# Patient Record
Sex: Female | Born: 1955
Health system: Southern US, Community
[De-identification: ages and names within clinical notes are randomized; demographics above are authoritative.]

## PROBLEM LIST (undated history)

## (undated) HISTORY — PX: FOOT SURGERY: SHX648

---

## 2000-11-05 ENCOUNTER — Emergency Department (HOSPITAL_COMMUNITY): Admission: EM | Admit: 2000-11-05 | Discharge: 2000-11-05 | Payer: Self-pay | Admitting: Emergency Medicine

## 2000-11-07 ENCOUNTER — Emergency Department (HOSPITAL_COMMUNITY): Admission: EM | Admit: 2000-11-07 | Discharge: 2000-11-07 | Payer: Self-pay | Admitting: Emergency Medicine

## 2001-03-19 ENCOUNTER — Encounter: Payer: Self-pay | Admitting: Internal Medicine

## 2001-03-19 ENCOUNTER — Ambulatory Visit (HOSPITAL_COMMUNITY): Admission: RE | Admit: 2001-03-19 | Discharge: 2001-03-19 | Payer: Self-pay | Admitting: Internal Medicine

## 2003-01-29 ENCOUNTER — Emergency Department (HOSPITAL_COMMUNITY): Admission: EM | Admit: 2003-01-29 | Discharge: 2003-01-29 | Payer: Self-pay | Admitting: *Deleted

## 2005-06-08 ENCOUNTER — Other Ambulatory Visit: Admission: RE | Admit: 2005-06-08 | Discharge: 2005-06-08 | Payer: Self-pay | Admitting: Obstetrics & Gynecology

## 2008-11-06 ENCOUNTER — Ambulatory Visit (HOSPITAL_BASED_OUTPATIENT_CLINIC_OR_DEPARTMENT_OTHER): Admission: RE | Admit: 2008-11-06 | Discharge: 2008-11-06 | Payer: Self-pay | Admitting: Orthopedic Surgery

## 2010-11-26 LAB — POCT HEMOGLOBIN-HEMACUE: Hemoglobin: 13.3 g/dL (ref 12.0–15.0)

## 2010-12-29 NOTE — Op Note (Signed)
NAMEANJANA, CHEEK NO.:  [id number]   MEDICAL RECORD NO.:  [id number]          PATIENT TYPE:  AMB   LOCATION:  [facility]                         FACILITY:  [facility]   PHYSICIAN:  Ollen Gross, M.D.    DATE OF BIRTH:  Jun 13, 1956   DATE OF PROCEDURE:  11/06/2008  DATE OF DISCHARGE:                               OPERATIVE REPORT   PREOPERATIVE DIAGNOSIS:  Right knee meniscal tear and lateral patellar  tilt.   POSTOPERATIVE DIAGNOSIS:  Right knee meniscal tear and lateral patellar  tilt.   OPERATION/PROCEDURE:  Right knee arthroscopy with chondroplasty,  meniscal debridement and lateral retinacular release.   SURGEON:  Ollen Gross, M.D.   ANESTHESIA:  General.   ESTIMATED BLOOD LOSS:  Minimal.   DRAINS:  Hemovac times one.   COMPLICATIONS:  None.   CONDITION:  Stable to recovery room.   CLINICAL NOTE:  Gavin Pound is a 55 year old female who has had a long  history of problems related to her right knee.  She has had a lot of  pain and mechanical-type symptoms.  Exam and history suggested meniscal  tear.  She also had significant patellofemoral symptoms and has  significant lateral tilt of the patella.  She presents now for the above-  mentioned procedure.   PROCEDURE IN DETAIL:  After successful administration of general  anesthetic, a tourniquet is placed on the right thigh and right lower  extremity is prepped and draped in the usual sterile fashion.  Standard  superomedial and lateral incisions were made.  Inflow cannula passed  superomedial, camera passed inferolateral.  Arthroscopic visualization  proceeds.  Undersurface of patella shows some mild chondromalacia and a  little unstable cartilage on the lateral aspect.  The trochlea also has  some chondromalacia laterally.  Centrally the trochlea looks normal as  does the medial aspect.  The medial and lateral gutters are visualized.  There are no loose bodies.  Flexion valgus force applied and to the  knee  and the medial compartment entered.  The medial compartment shows no  evidence of any tears or chondral defects.  A spinal needle is used to  localize the inferomedial portal.  Small incision made, dilator placed.  Intercondylar notch is then visualized.  The ACL appears normal.  Lateral compartment entered and there is no evidence of any articular  cartilage damage, but there is a tear in the posterior horn lateral  meniscus.  This is debrided back to stable base with baskets and a 4.2  mm shaver.  It is probed and found to be stable.  I then addressed the  patellofemoral compartment and with the shaver debrided any unstable  cartilage from the trochlea and patella.  We did not have to debride  down to bone as there was good stable cartilaginous base.  I then marked  the junction of the superior and lateral borders of patella with the  spinal needle.  We switched the cannula to do the camera inferomedial  and working portal inferolateral.  I began a lateral release starting at  the spot marked with a spinal needle and  took that down to the  inferolateral portal.  We use arthroscopic cautery for this.  Once the  release was completed, then I decreased the inflow pressure to identify  any bleeding points.  All bleeding is coagulated with the cautery.  The  joints again inspected.  There are no other tears or loose bodies noted.  The arthroscope is then removed from the inferior portals which are  closed with interrupted 4-0 nylon and 20 mL of 0.25% Marcaine with epi  injected through the inflow cannula.  Then a Hemovac drain was threaded  through the cannula and the cannula is removed.  The incisions closed,  but the drain is not sewn in.  Drains hooked to suction.  The incisions  cleaned and dried and bulky sterile dressing applied.  She is then  awakened and transferred to recovery in stable condition.      Ollen Gross, M.D.  Electronically Signed     FA/MEDQ  D:   11/06/2008  T:  11/07/2008  Job:  [id number]

## 2011-03-11 ENCOUNTER — Emergency Department (HOSPITAL_COMMUNITY): Payer: BC Managed Care – PPO

## 2011-03-11 ENCOUNTER — Emergency Department (HOSPITAL_COMMUNITY)
Admission: EM | Admit: 2011-03-11 | Discharge: 2011-03-11 | Disposition: A | Discharge status: Home / Self Care | Payer: BC Managed Care – PPO | Attending: Emergency Medicine | Admitting: Emergency Medicine

## 2011-03-11 DIAGNOSIS — IMO0002 Reserved for concepts with insufficient information to code with codable children: Secondary | ICD-10-CM | POA: Insufficient documentation

## 2011-03-11 DIAGNOSIS — W010XXA Fall on same level from slipping, tripping and stumbling without subsequent striking against object, initial encounter: Secondary | ICD-10-CM | POA: Insufficient documentation

## 2011-03-11 DIAGNOSIS — M25569 Pain in unspecified knee: Secondary | ICD-10-CM | POA: Insufficient documentation

## 2011-03-11 DIAGNOSIS — Y9289 Other specified places as the place of occurrence of the external cause: Secondary | ICD-10-CM | POA: Insufficient documentation

## 2011-03-11 DIAGNOSIS — Y9301 Activity, walking, marching and hiking: Secondary | ICD-10-CM | POA: Insufficient documentation

## 2011-03-11 DIAGNOSIS — M79609 Pain in unspecified limb: Secondary | ICD-10-CM | POA: Insufficient documentation

## 2012-02-01 ENCOUNTER — Telehealth: Payer: Self-pay

## 2012-02-01 ENCOUNTER — Ambulatory Visit (INDEPENDENT_AMBULATORY_CARE_PROVIDER_SITE_OTHER): Payer: BC Managed Care – PPO | Admitting: Physician Assistant

## 2012-02-01 VITALS — BP 139/75 | HR 86 | Temp 98.6°F | Resp 18 | Ht 66.0 in | Wt 181.0 lb

## 2012-02-01 DIAGNOSIS — R05 Cough: Secondary | ICD-10-CM

## 2012-02-01 DIAGNOSIS — R059 Cough, unspecified: Secondary | ICD-10-CM

## 2012-02-01 DIAGNOSIS — J40 Bronchitis, not specified as acute or chronic: Secondary | ICD-10-CM

## 2012-02-01 MED ORDER — AZITHROMYCIN 250 MG PO TABS: ORAL_TABLET | ORAL | Status: AC

## 2012-02-01 MED ORDER — IPRATROPIUM BROMIDE 0.06 % NA SOLN: 2.0000 | Freq: Three times a day (TID) | NASAL | Status: AC

## 2012-02-01 MED ORDER — BENZONATATE 100 MG PO CAPS: 100.0000 mg | ORAL_CAPSULE | Freq: Three times a day (TID) | ORAL | Status: AC | PRN

## 2012-02-01 NOTE — Telephone Encounter (Signed)
PT WAS JUST SEEN BY RYAN AND WANTED TO KNOW IF THEY COULD CONTINUE TAKING THE TYLENOL. PLEASE CALL [phone]

## 2012-02-01 NOTE — Progress Notes (Signed)
Patient ID: JAHIRA SWISS MRN: [id number], DOB: July 17, 1956, 56 y.o. Date of Encounter: 02/01/2012, 9:27 AM  Primary Physician: Ginette Otto, MD  Chief Complaint:  Chief Complaint  Patient presents with  . Nasal Congestion    all sx's over 1 week- has tried many OTC meds  . Cough  . Sore Throat  . Fever    low-grade at night    HPI: 56 y.o. year old female presents with a seven day history of nasal congestion, post nasal drip, sore throat, and cough. Mild sinus pressure. Afebrile. No chills. Nasal congestion thick and yellow. Cough is mildly productive of green/yellow sputum and not associated with time of day. Ears feel full, leading to sensation of muffled hearing. Has tried OTC cold preps without success. No GI complaints. Appetite normal.  No sick contacts, recent antibiotics, or recent travels.   No leg trauma, sedentary periods, h/o cancer, or tobacco use.  No past medical history on file.   Home Meds: Prior to Admission medications   Medication Sig Start Date End Date Taking? Authorizing Provider  calcium carbonate (OS-CAL) 600 MG TABS Take 600 mg by mouth 2 (two) times daily with a meal.   Yes Historical Provider, MD  Multiple Vitamin (MULTIVITAMIN) tablet Take 1 tablet by mouth daily.   Yes Historical Provider, MD                         Allergies:  Allergies  Allergen Reactions  . Codeine Hives and Nausea Only  . Darvocet (Propoxyphene-Acetaminophen) Hives and Nausea Only  . Penicillins Hives and Nausea Only  . Percocet (Oxycodone-Acetaminophen) Hives and Nausea Only  . Sulfa Antibiotics Hives and Nausea Only    History   Social History  . Marital Status: Single    Spouse Name: N/A    Number of Children: N/A  . Years of Education: N/A   Occupational History  . Not on file.   Social History Main Topics  . Smoking status: Former Games developer  . Smokeless tobacco: Not on file  . Alcohol Use: Not on file  . Drug Use: Not on file  . Sexually  Active: Not on file   Other Topics Concern  . Not on file   Social History Narrative  . No narrative on file     Review of Systems: Constitutional: negative for chills, fever, night sweats or weight changes Cardiovascular: negative for chest pain or palpitations Respiratory: negative for hemoptysis, wheezing, or shortness of breath Abdominal: negative for abdominal pain, nausea, vomiting or diarrhea Dermatological: negative for rash Neurologic: negative for headache   Physical Exam: Blood pressure 139/75, pulse 86, temperature 98.6 F (37 C), temperature source Oral, resp. rate 18, height 5\' 6"  (1.676 m), weight 181 lb (82.101 kg)., Body mass index is 29.21 kg/(m^2). General: Well developed, well nourished, in no acute distress. Head: Normocephalic, atraumatic, eyes without discharge, sclera non-icteric, nares are congested. Bilateral auditory canals clear, TM's are without perforation, pearly grey with reflective cone of light bilaterally. No sinus TTP. Oral cavity moist, dentition normal. Posterior pharynx with post nasal drip and mild erythema. No peritonsillar abscess or tonsillar exudate. Neck: Supple. No thyromegaly. Full ROM. No lymphadenopathy. Lungs: Coarse breath sounds bilaterally without wheezes, rales, or rhonchi. Breathing is unlabored.  Heart: RRR with S1 S2. No murmurs, rubs, or gallops appreciated. Msk:  Strength and tone normal for age. Extremities: No clubbing or cyanosis. No edema. Neuro: Alert and oriented X 3. Moves all  extremities spontaneously. CNII-XII grossly in tact. Psych:  Responds to questions appropriately with a normal affect.     ASSESSMENT AND PLAN:  56 y.o. year old female with bronchitis. -Azithromycin 250 MG #6 2 po first day then 1 po next 4 days no RF -Tessalon Perles 100 mg 1 po tid prn cough #30 no RF  -Atrovent NS 0.06% 2 sprays each nare bid prn #1 no RF -Mucinex -Tylenol/Motrin prn -Rest/fluids -RTC precautions -RTC 3-5 days if no  improvement  Signed, Eula Listen, PA-C 02/01/2012 9:27 AM

## 2012-02-01 NOTE — Telephone Encounter (Signed)
ADVISED PT PER RYAN'S NOTE, IT IS OK TO TAKE TYLENOL/MOTRIN PRN.

## 2012-10-23 ENCOUNTER — Other Ambulatory Visit: Payer: Self-pay | Admitting: Podiatry

## 2012-10-23 DIAGNOSIS — M79671 Pain in right foot: Secondary | ICD-10-CM

## 2012-10-23 DIAGNOSIS — M79672 Pain in left foot: Secondary | ICD-10-CM

## 2012-10-27 ENCOUNTER — Ambulatory Visit
Admission: RE | Admit: 2012-10-27 | Discharge: 2012-10-27 | Disposition: A | Discharge status: Home / Self Care | Payer: BC Managed Care – PPO | Source: Ambulatory Visit | Attending: Podiatry | Admitting: Podiatry

## 2012-10-27 DIAGNOSIS — M79672 Pain in left foot: Secondary | ICD-10-CM

## 2013-02-14 ENCOUNTER — Other Ambulatory Visit: Payer: Self-pay | Admitting: Orthopedic Surgery

## 2013-02-14 DIAGNOSIS — M8430XD Stress fracture, unspecified site, subsequent encounter for fracture with routine healing: Secondary | ICD-10-CM

## 2013-02-26 ENCOUNTER — Ambulatory Visit
Admission: RE | Admit: 2013-02-26 | Discharge: 2013-02-26 | Disposition: A | Discharge status: Home / Self Care | Payer: BC Managed Care – PPO | Source: Ambulatory Visit | Attending: Orthopedic Surgery | Admitting: Orthopedic Surgery

## 2013-02-26 DIAGNOSIS — M8430XD Stress fracture, unspecified site, subsequent encounter for fracture with routine healing: Secondary | ICD-10-CM

## 2014-02-22 ENCOUNTER — Ambulatory Visit (INDEPENDENT_AMBULATORY_CARE_PROVIDER_SITE_OTHER): Payer: BC Managed Care – PPO

## 2014-02-22 VITALS — BP 132/73 | HR 73 | Resp 18

## 2014-02-22 DIAGNOSIS — Q828 Other specified congenital malformations of skin: Secondary | ICD-10-CM

## 2014-02-22 DIAGNOSIS — B070 Plantar wart: Secondary | ICD-10-CM

## 2014-02-22 NOTE — Progress Notes (Signed)
   Subjective:    Patient ID: Paige Diaz, female    DOB: 05-12-1956, 58 y.o.   MRN: [id number]  HPI I HAVE SOME PLACES ON THE SIDES OF BOTH FEET AND I HAD SURGERY ON MY LEFT FOOT LAST YEAR AND SORE AND TENDER AND HAVE TRIED TO TRIM OM THEM AND TRIED OVER THE COUNTER     Review of Systems  All other systems reviewed and are negative.      Objective:   Physical Exam 58 year old white female well-developed well-nourished oriented x3 with vital signs stable. Patient presents with a history of lesions sub-fifth MTP area and base of the fifth metatarsal bilateral. These diffuse callused areas also have central nucleated lesions within the central portion of the keratoses possibly consistent with porokeratosis versus verruca plantaris  Lower 30 objective findings reveal intact neurovascular status with pedal pulses palpable DP and PT +2/4 capillary refill time 3 seconds epicritic sensations intact and symmetric bilateral normal plantar response DTRs not elicited dermatologically skin color pigment normal hair growth diminished to absent there is keratoses sub fifth metatarsal head bilateral and along the styloid process or fifth metatarsal base bilateral. Within the keratoses are nucleated porokeratotic lesions or what appeared be verruca 2-3 small island lesions are noted within each callus these are painful on direct lateral compression. Patient's try treating in the past myself debridement also utilizing a 20% urea cream which provided temporary relief orthopedic biomechanical exam is remarkable for history of old Jones fracture left foot treated with screw fixation more than a year ago otherwise rectus foot type mild adductovarus rotation lesser digits 34 and 5 with some I. flexible contractures noted no open wounds ulcerations no secondary infection is noted       Assessment & Plan:  Assessment this time as verruca plantaris for support keratoses plantar foot sub-lateral column fifth met and  fifth met base areas. These lesions are debrided and dressed this time utilizing topical salicylic acid instructions for topical Occlusal or salicylic acid applications given to patient this time recommended applying daily for anywhere from 5-7 days or even up to 2 weeks if needed apply the salicylic acid under occlusion utilizing duct tape as instructed.   Contact the office if changes or exacerbations in the interim or within a month or 2 if it fails to resolve may consider more invasive or aggressive options.  Lynsay Fesperman DPM Harriet Masson DPM

## 2014-02-22 NOTE — Patient Instructions (Signed)

## 2015-03-24 ENCOUNTER — Ambulatory Visit: Payer: BC Managed Care – PPO | Admitting: Podiatry

## 2015-03-28 ENCOUNTER — Ambulatory Visit (INDEPENDENT_AMBULATORY_CARE_PROVIDER_SITE_OTHER): Payer: BC Managed Care – PPO | Admitting: Podiatry

## 2015-03-28 ENCOUNTER — Encounter: Payer: Self-pay | Admitting: Podiatry

## 2015-03-28 VITALS — BP 126/77 | HR 68 | Resp 12

## 2015-03-28 DIAGNOSIS — Q828 Other specified congenital malformations of skin: Secondary | ICD-10-CM

## 2015-04-03 NOTE — Progress Notes (Signed)
Paige Diaz, female   DOB: [birth year], 59 y.o.   MRN: [id number]  Subjective: 59 year old female presents the office today with complaints of painful lesions to the bottoms of both of her feet. She states that she tries to trim the areas herself between appointments and she has tried some over-the-counter wart treatment as she was told her last appointment she did have warts as well. She said that she has not been consistent with the treatment. She states that the lesions of foot ingrown which cause pain with ambulation and pressure. Denies any redness or drainage. No other complaints at this time.  Objective: AAO 3, NAD DP/PT pulses palpable, CRT less than 3 seconds Protective sensation intact with Simms Weinstein monofilament. Bilateral submetatarsal 5 head and fifth metatarsal base hyperkeratotic lesions. Upon debridement there is no underlying ulceration, drainage. There is no pinpoint bleeding or evidence of verruca. There is a small nucleated hyperkeratotic lesion to the right submetatarsal 3. A smaller lesion also noted on the left. Upon debridement of the right second metatarsal 3 lesion no underlying ulceration, drainage or other signs of infection. Most of the tenderness appears to be right submetatarsal 3. There is no pinpoint area bony tenderness or pain the vibratory sensation. No open lesions or pre-ulcer lesions identified elsewhere. No pain with calf compression, swelling, warmth, erythema.  Assessment: 59 year old female bilateral porokeratosis  Plan: -Treatment options discussed including all alternatives, risks, and complications -Lesion sharply debrided 6 without complication/bleeding. The areas were cleaned. A pad was placed around the right submetatarsal 3 lesion and salinocaine was applied followed by dry sterile dressing. Post procedure instructions were discussed the patient. Monitor for any clinical signs or symptoms of infection and directed to call the  office immediately should any occur or go to the ER. Follow-up as needed. In the meantime I encouraged her to call the office with any questions, concerns, change in symptoms.  Ovid Curd, DPM

## 2015-12-10 ENCOUNTER — Other Ambulatory Visit: Payer: Self-pay | Admitting: Obstetrics & Gynecology

## 2015-12-10 DIAGNOSIS — E2839 Other primary ovarian failure: Secondary | ICD-10-CM

## 2015-12-26 ENCOUNTER — Ambulatory Visit
Admission: RE | Admit: 2015-12-26 | Discharge: 2015-12-26 | Disposition: A | Discharge status: Home / Self Care | Payer: BC Managed Care – PPO | Source: Ambulatory Visit | Attending: Obstetrics & Gynecology | Admitting: Obstetrics & Gynecology

## 2015-12-26 DIAGNOSIS — E2839 Other primary ovarian failure: Secondary | ICD-10-CM

## 2018-02-07 ENCOUNTER — Other Ambulatory Visit: Payer: Self-pay | Admitting: Obstetrics & Gynecology

## 2018-02-07 DIAGNOSIS — E2839 Other primary ovarian failure: Secondary | ICD-10-CM

## 2018-03-06 ENCOUNTER — Ambulatory Visit (INDEPENDENT_AMBULATORY_CARE_PROVIDER_SITE_OTHER): Payer: Self-pay | Admitting: Nurse Practitioner

## 2018-03-06 VITALS — BP 120/84 | HR 74 | Wt 171.8 lb

## 2018-03-06 DIAGNOSIS — L209 Atopic dermatitis, unspecified: Secondary | ICD-10-CM

## 2018-03-06 MED ORDER — TRIAMCINOLONE ACETONIDE 0.1 % EX OINT: 1.0000 "application " | TOPICAL_OINTMENT | Freq: Two times a day (BID) | CUTANEOUS | 0 refills | Status: AC

## 2018-03-06 NOTE — Patient Instructions (Signed)

## 2018-03-06 NOTE — Progress Notes (Signed)
Subjective:     Paige Diaz is a 62 y.o. female who presents for evaluation of a rash involving the right antecubital. Rash started 4 days ago. Lesions are erythematous, and flat in texture. Rash has not changed over time. Rash is pruritic. Associated symptoms: none. Patient denies: fever, headache, irritability, nausea, sore throat and vomiting. Patient has not had contacts with similar rash. Patient has not had new exposures (soaps, lotions, laundry detergents, foods, medications, plants, insects or animals).  States she has tried the use of over-the-counter hydrocortisone without relief.  The following portions of the patient's history were reviewed and updated as appropriate: allergies, current medications and past medical history.  Review of Systems Constitutional: negative Eyes: negative Ears, nose, mouth, throat, and face: negative Respiratory: negative Cardiovascular: negative Integument/breast: positive for pruritus, rash and skin color change, negative for dryness and drainage    Objective:    There were no vitals taken for this visit. General:  alert, cooperative and no distress  Skin:  plaque noted on right antecubital measuring approximately 4cm x 4cm in diameter; +scaling, erythematous, flat, + skin thickening, no drainage or discharge,      Assessment:    atopic dermatitis    Plan:  Exam findings, diagnosis etiology and medication use and indications reviewed with patient. Follow- Up and discharge instructions provided. No emergent/urgent issues found on exam.  Patient verbalized understanding of information provided and agrees with plan of care (POC), all questions answered.  1. Atopic dermatitis, unspecified type  - triamcinolone ointment (KENALOG) 0.1 %; Apply 1 application topically 2 (two) times daily for 14 days. Apply twice daily for 14 days or until symptoms improve.  Dispense: 30 g; Refill: 0

## 2018-03-09 ENCOUNTER — Telehealth: Payer: Self-pay

## 2018-03-09 NOTE — Telephone Encounter (Signed)
I was no able to contact the patient in the number provided  

## 2018-11-13 ENCOUNTER — Other Ambulatory Visit: Payer: Self-pay | Admitting: Nurse Practitioner

## 2018-11-13 DIAGNOSIS — L209 Atopic dermatitis, unspecified: Secondary | ICD-10-CM

## 2018-11-14 ENCOUNTER — Other Ambulatory Visit: Payer: Self-pay | Admitting: Nurse Practitioner

## 2018-11-14 DIAGNOSIS — L209 Atopic dermatitis, unspecified: Secondary | ICD-10-CM

## 2020-01-09 ENCOUNTER — Other Ambulatory Visit (HOSPITAL_COMMUNITY): Payer: Self-pay | Admitting: Physician Assistant

## 2020-01-24 ENCOUNTER — Other Ambulatory Visit (HOSPITAL_COMMUNITY): Payer: Self-pay | Admitting: Obstetrics & Gynecology

## 2020-04-09 ENCOUNTER — Other Ambulatory Visit (HOSPITAL_COMMUNITY): Payer: Self-pay | Admitting: Sports Medicine

## 2020-11-19 ENCOUNTER — Other Ambulatory Visit (HOSPITAL_COMMUNITY): Payer: Self-pay

## 2020-11-19 MED ORDER — TRIAMCINOLONE ACETONIDE 0.1 % EX OINT
TOPICAL_OINTMENT | CUTANEOUS | 1 refills | Status: AC
  Filled 2020-11-19: qty 30, 14d supply, fill #0

## 2020-11-19 MED FILL — Triamcinolone Acetonide Cream 0.1%: CUTANEOUS | 14 days supply | Qty: 30 | Fill #0 | Status: AC

## 2020-11-20 ENCOUNTER — Other Ambulatory Visit (HOSPITAL_COMMUNITY): Payer: Self-pay

## 2020-11-21 ENCOUNTER — Other Ambulatory Visit (HOSPITAL_COMMUNITY): Payer: Self-pay

## 2021-01-04 MED FILL — Pantoprazole Sodium EC Tab 40 MG (Base Equiv): ORAL | 30 days supply | Qty: 30 | Fill #0 | Status: AC

## 2021-01-04 MED FILL — Ibuprofen Tab 800 MG: ORAL | 30 days supply | Qty: 90 | Fill #0 | Status: AC

## 2021-01-04 MED FILL — Triamcinolone Acetonide Cream 0.1%: CUTANEOUS | 14 days supply | Qty: 30 | Fill #1 | Status: AC

## 2021-01-05 ENCOUNTER — Other Ambulatory Visit (HOSPITAL_COMMUNITY): Payer: Self-pay

## 2021-01-27 ENCOUNTER — Other Ambulatory Visit: Payer: Self-pay | Admitting: Geriatric Medicine

## 2021-01-29 ENCOUNTER — Other Ambulatory Visit: Payer: Self-pay | Admitting: Geriatric Medicine

## 2021-01-29 DIAGNOSIS — M545 Low back pain, unspecified: Secondary | ICD-10-CM

## 2021-02-18 ENCOUNTER — Ambulatory Visit
Admission: RE | Admit: 2021-02-18 | Discharge: 2021-02-18 | Disposition: A | Discharge status: Home / Self Care | Payer: Self-pay | Source: Ambulatory Visit | Attending: Geriatric Medicine | Admitting: Geriatric Medicine

## 2021-02-18 ENCOUNTER — Other Ambulatory Visit: Payer: Self-pay

## 2021-02-18 DIAGNOSIS — M545 Low back pain, unspecified: Secondary | ICD-10-CM

## 2021-02-18 MED ORDER — GADOBENATE DIMEGLUMINE 529 MG/ML IV SOLN
17.0000 mL | Freq: Once | INTRAVENOUS | Status: AC | PRN
  Administered 2021-02-18: 17 mL via INTRAVENOUS

## 2021-03-05 ENCOUNTER — Other Ambulatory Visit: Payer: Self-pay | Admitting: Geriatric Medicine

## 2021-03-05 DIAGNOSIS — M8580 Other specified disorders of bone density and structure, unspecified site: Secondary | ICD-10-CM

## 2021-04-07 ENCOUNTER — Other Ambulatory Visit (HOSPITAL_COMMUNITY): Payer: Self-pay

## 2021-04-07 MED ORDER — TRIAMCINOLONE ACETONIDE 0.1 % EX CREA
TOPICAL_CREAM | Freq: Two times a day (BID) | CUTANEOUS | 3 refills | Status: AC
  Filled 2021-04-07: qty 30, 15d supply, fill #0
  Filled 2022-03-01: qty 90, 45d supply, fill #1

## 2021-04-07 MED FILL — Ibuprofen Tab 800 MG: ORAL | 30 days supply | Qty: 90 | Fill #1 | Status: AC

## 2021-07-06 ENCOUNTER — Ambulatory Visit: Payer: BC Managed Care – PPO | Admitting: Podiatry

## 2021-07-07 ENCOUNTER — Other Ambulatory Visit: Payer: Self-pay

## 2021-07-07 ENCOUNTER — Ambulatory Visit (INDEPENDENT_AMBULATORY_CARE_PROVIDER_SITE_OTHER): Payer: No Typology Code available for payment source | Admitting: Podiatry

## 2021-07-07 DIAGNOSIS — Q828 Other specified congenital malformations of skin: Secondary | ICD-10-CM | POA: Diagnosis not present

## 2021-07-07 NOTE — Progress Notes (Signed)
Subjective:  Patient ID: Paige Diaz, female    DOB: 1955/10/19,  MRN: [id number] HPI Chief Complaint  Patient presents with   Callouses    Right foot callus- recurring had them debrided years ago- mentioned    65 y.o. female presents with the above complaint.   ROS: Denies fever chills nausea vomiting muscle aches pains calf pain back pain chest pain shortness of breath.  No past medical history on file. Past Surgical History:  Procedure Laterality Date   FOOT SURGERY     left    Current Outpatient Medications:    calcium carbonate (OS-CAL) 600 MG TABS, Take 600 mg by mouth 2 (two) times daily with a meal., Disp: , Rfl:    calcium citrate-vitamin D (CITRACAL+D) 315-200 MG-UNIT per tablet, Take 1 tablet by mouth 2 (two) times daily., Disp: , Rfl:    diclofenac sodium (VOLTAREN) 1 % GEL, Apply topically 4 (four) times daily., Disp: , Rfl:    ibuprofen (ADVIL,MOTRIN) 800 MG tablet, Take 800 mg by mouth every 8 (eight) hours as needed., Disp: , Rfl:    ipratropium (ATROVENT) 0.06 % nasal spray, Place 2 sprays into the nose 3 (three) times daily., Disp: 15 mL, Rfl: 0   magnesium gluconate (MAGONATE) 500 MG tablet, Take 250 mg by mouth 2 (two) times daily., Disp: , Rfl:    Multiple Vitamin (MULTIVITAMIN) tablet, Take 1 tablet by mouth daily., Disp: , Rfl:    pantoprazole (PROTONIX) 40 MG tablet, TAKE 1 TABLET BY MOUTH ONCE DAILY 30 MINUTES BEFORE BREAKFAST, Disp: 30 tablet, Rfl: 12   triamcinolone cream (KENALOG) 0.1 %, Apply topically to the affected areas 2 times a day, Disp: 30 g, Rfl: 3   triamcinolone ointment (KENALOG) 0.1 %, Apply to affected area twice daily as needed., Disp: 30 g, Rfl: 1   vitamin C (ASCORBIC ACID) 500 MG tablet, Take 500 mg by mouth daily., Disp: , Rfl:   Allergies  Allergen Reactions   Codeine Hives and Nausea Only   Darvocet [Propoxyphene N-Acetaminophen] Hives and Nausea Only   Penicillins Hives and Nausea Only   Percocet [Oxycodone-Acetaminophen]  Hives and Nausea Only   Sulfa Antibiotics Hives and Nausea Only   Review of Systems Objective:  There were no vitals filed for this visit.  General: Well developed, nourished, in no acute distress, alert and oriented x3   Dermatological: Skin is warm, dry and supple bilateral. Nails x 10 are well maintained; remaining integument appears unremarkable at this time. There are no open sores, no preulcerative lesions, no rash or signs of infection present.  Diffuse tyloma to the forefoot bilateral though she does have a punctated benign hyperkeratotic lesions of the fifth met base bilaterally right greater than left.  Vascular: Dorsalis Pedis artery and Posterior Tibial artery pedal pulses are 2/4 bilateral with immedate capillary fill time. Pedal hair growth present. No varicosities and no lower extremity edema present bilateral.   Neruologic: Grossly intact via light touch bilateral. Vibratory intact via tuning fork bilateral. Protective threshold with Semmes Wienstein monofilament intact to all pedal sites bilateral. Patellar and Achilles deep tendon reflexes 2+ bilateral. No Babinski or clonus noted bilateral.   Musculoskeletal: No gross boney pedal deformities bilateral. No pain, crepitus, or limitation noted with foot and ankle range of motion bilateral. Muscular strength 5/5 in all groups tested bilateral.  Gait: Unassisted, Nonantalgic.    Radiographs:  None taken  Assessment & Plan:   Assessment: Benign skin lesions bilateral  Plan: Mechanical debridement followed by  chemical debridement today salicylic acid under occlusion to be left on for 3 days then washed off thoroughly.  Follow-up with her with questions or concerns or on an as-needed basis.     Jaanai Salemi T. [place], [place]

## 2021-08-26 ENCOUNTER — Other Ambulatory Visit: Payer: Self-pay

## 2021-10-03 ENCOUNTER — Encounter (HOSPITAL_COMMUNITY): Payer: Self-pay | Admitting: Emergency Medicine

## 2021-10-03 ENCOUNTER — Ambulatory Visit (HOSPITAL_COMMUNITY)
Admission: EM | Admit: 2021-10-03 | Discharge: 2021-10-03 | Disposition: A | Discharge status: Home / Self Care | Payer: No Typology Code available for payment source | Attending: Emergency Medicine | Admitting: Emergency Medicine

## 2021-10-03 DIAGNOSIS — U071 COVID-19: Secondary | ICD-10-CM | POA: Diagnosis not present

## 2021-10-03 MED ORDER — NIRMATRELVIR/RITONAVIR (PAXLOVID) TABLET (RENAL DOSING): 2.0000 | ORAL_TABLET | Freq: Two times a day (BID) | ORAL | 0 refills | Status: AC

## 2021-10-03 MED ORDER — BENZONATATE 100 MG PO CAPS: 100.0000 mg | ORAL_CAPSULE | Freq: Three times a day (TID) | ORAL | 0 refills | Status: AC | PRN

## 2021-10-03 NOTE — ED Provider Notes (Signed)
[facility]   [id number] 10/03/21 Arrival Time: 1629   Chief Complaint  Patient presents with   Covid Positive     SUBJECTIVE: History from: patient.  Paige Diaz is a 66 y.o. female who presented to the urgent care for complaint of nasal congestion, cough  and  body aches that started this past Thursday.  Reports she tested positive for COVID-19 via home COVID test.  Denies recent travel.  Has tried OTC medication without relief.  Denies alleviating or aggravating factors.  Denies reports/ denies previous symptoms in the past.   Denies fatigue, sinus pain, rhinorrhea, sore throat, SOB, wheezing, chest pain, nausea, changes in bowel or bladder habits.     ROS: As per HPI.  All other pertinent ROS negative.     History reviewed. No pertinent past medical history. Past Surgical History:  Procedure Laterality Date   FOOT SURGERY     left   Allergies  Allergen Reactions   Codeine Hives and Nausea Only   Darvocet [Propoxyphene N-Acetaminophen] Hives and Nausea Only   Penicillins Hives and Nausea Only   Percocet [Oxycodone-Acetaminophen] Hives and Nausea Only   Sulfa Antibiotics Hives and Nausea Only   No current facility-administered medications on file prior to encounter.   Current Outpatient Medications on File Prior to Encounter  Medication Sig Dispense Refill   calcium carbonate (OS-CAL) 600 MG TABS Take 600 mg by mouth 2 (two) times daily with a meal.     calcium citrate-vitamin D (CITRACAL+D) 315-200 MG-UNIT per tablet Take 1 tablet by mouth 2 (two) times daily.     diclofenac sodium (VOLTAREN) 1 % GEL Apply topically 4 (four) times daily.     ibuprofen (ADVIL,MOTRIN) 800 MG tablet Take 800 mg by mouth every 8 (eight) hours as needed.     ipratropium (ATROVENT) 0.06 % nasal spray Place 2 sprays into the nose 3 (three) times daily. 15 mL 0   magnesium gluconate (MAGONATE) 500 MG tablet Take 250 mg by mouth 2 (two) times daily.     Multiple Vitamin  (MULTIVITAMIN) tablet Take 1 tablet by mouth daily.     pantoprazole (PROTONIX) 40 MG tablet TAKE 1 TABLET BY MOUTH ONCE DAILY 30 MINUTES BEFORE BREAKFAST 30 tablet 12   triamcinolone cream (KENALOG) 0.1 % Apply topically to the affected areas 2 times a day 30 g 3   triamcinolone ointment (KENALOG) 0.1 % Apply to affected area twice daily as needed. 30 g 1   vitamin C (ASCORBIC ACID) 500 MG tablet Take 500 mg by mouth daily.     Social History   Socioeconomic History   Marital status: Single    Spouse name: Not on file   Number of children: Not on file   Years of education: Not on file   Highest education level: Not on file  Occupational History   Not on file  Tobacco Use   Smoking status: Never   Smokeless tobacco: Not on file  Substance and Sexual Activity   Alcohol use: Not on file   Drug use: Not on file   Sexual activity: Not on file  Other Topics Concern   Not on file  Social History Narrative   Not on file   Social Determinants of Health   Financial Resource Strain: Not on file  Food Insecurity: Not on file  Transportation Needs: Not on file  Physical Activity: Not on file  Stress: Not on file  Social Connections: Not on file  Intimate Partner Violence: Not  on file   No family history on file.  OBJECTIVE:  Vitals:   10/03/21 1728  BP: 135/68  Pulse: 78  Resp: 18  Temp: 99 F (37.2 C)  TempSrc: Oral  SpO2: 98%     General appearance: alert; appears fatigued, but nontoxic; speaking in full sentences and tolerating own secretions HEENT: NCAT; Ears: EACs clear, TMs pearly gray; Eyes: PERRL.  EOM grossly intact. Sinuses: nontender; Nose: nares patent without rhinorrhea, Throat: oropharynx clear, tonsils non erythematous or enlarged, uvula midline  Neck: supple without LAD Lungs: unlabored respirations, symmetrical air entry; cough: moderate wa ; no respiratory distress; CTAB Heart: regular rate and rhythm.  Radial pulses 2+ symmetrical bilaterally Skin:  warm and dry Psychological: alert and cooperative; normal mood and affect  LABS:  No results found for this or any previous visit (from the past 24 hour(s)).   ASSESSMENT & PLAN:  1. COVID-19 virus infection     Meds ordered this encounter  Medications   nirmatrelvir/ritonavir EUA, renal dosing, (PAXLOVID) 10 x 150 MG & 10 x 100MG  TABS    Sig: Take 2 tablets by mouth 2 (two) times daily for 5 days. Take nirmatrelvir (150 mg) one tablet twice daily for 5 days and ritonavir (100 mg) one tablet twice daily for 5 days.    Dispense:  20 tablet    Refill:  0    Discharge instructions   Get plenty of rest and push fluids Tessalon Perles prescribed for cough Paxlovid was prescribed Use medications daily for symptom relief Use OTC medications like ibuprofen or tylenol as needed fever or pain Call or go to the ED if you have any new or worsening symptoms such as fever, worsening cough, shortness of breath, chest tightness, chest pain, turning blue, changes in mental status, etc...   Reviewed expectations re: course of current medical issues. Questions answered. Outlined signs and symptoms indicating need for more acute intervention. Patient verbalized understanding. After Visit Summary given.          , FNP 10/03/21 1747

## 2021-10-03 NOTE — Discharge Instructions (Signed)
°  Get plenty of rest and push fluids Tessalon Perles prescribed for cough Paxlovid was prescribed Use medications daily for symptom relief Use OTC medications like ibuprofen or tylenol as needed fever or pain Call or go to the ED if you have any new or worsening symptoms such as fever, worsening cough, shortness of breath, chest tightness, chest pain, turning blue, changes in mental status, etc..Marland Kitchen

## 2021-10-03 NOTE — ED Triage Notes (Signed)
Pt reports that she having congestion, soreness, and feeling bad since Thursday. Reports bought covid test and took and believes is positive and brought with patient. Test is positive.

## 2021-12-08 ENCOUNTER — Other Ambulatory Visit (HOSPITAL_COMMUNITY): Payer: Self-pay

## 2021-12-08 MED ORDER — PANTOPRAZOLE SODIUM 40 MG PO TBEC
40.0000 mg | DELAYED_RELEASE_TABLET | Freq: Every day | ORAL | 3 refills | Status: AC
  Filled 2021-12-08 – 2021-12-09 (×2): qty 90, 90d supply, fill #0
  Filled 2022-03-01: qty 90, 90d supply, fill #1
  Filled 2022-11-03: qty 90, 90d supply, fill #2

## 2021-12-09 ENCOUNTER — Other Ambulatory Visit (HOSPITAL_COMMUNITY): Payer: Self-pay

## 2022-01-11 ENCOUNTER — Encounter (HOSPITAL_COMMUNITY): Payer: Self-pay | Admitting: Emergency Medicine

## 2022-01-11 ENCOUNTER — Ambulatory Visit (HOSPITAL_COMMUNITY)
Admission: EM | Admit: 2022-01-11 | Discharge: 2022-01-11 | Disposition: A | Discharge status: Home / Self Care | Payer: No Typology Code available for payment source | Attending: Emergency Medicine | Admitting: Emergency Medicine

## 2022-01-11 ENCOUNTER — Telehealth (HOSPITAL_COMMUNITY): Payer: Self-pay | Admitting: Emergency Medicine

## 2022-01-11 DIAGNOSIS — R21 Rash and other nonspecific skin eruption: Secondary | ICD-10-CM

## 2022-01-11 MED ORDER — VALACYCLOVIR HCL 1 G PO TABS: 1000.0000 mg | ORAL_TABLET | Freq: Three times a day (TID) | ORAL | 0 refills | Status: AC

## 2022-01-11 MED ORDER — VALACYCLOVIR HCL 1 G PO TABS: 1000.0000 mg | ORAL_TABLET | Freq: Three times a day (TID) | ORAL | 0 refills | Status: DC

## 2022-01-11 MED ORDER — MUPIROCIN 2 % EX OINT: 1.0000 "application " | TOPICAL_OINTMENT | Freq: Two times a day (BID) | CUTANEOUS | 0 refills | Status: DC

## 2022-01-11 MED ORDER — MUPIROCIN CALCIUM 2 % EX CREA: 1.0000 "application " | TOPICAL_CREAM | Freq: Two times a day (BID) | CUTANEOUS | 0 refills | Status: DC

## 2022-01-11 MED ORDER — MUPIROCIN 2 % EX OINT: 1.0000 "application " | TOPICAL_OINTMENT | Freq: Two times a day (BID) | CUTANEOUS | 0 refills | Status: AC

## 2022-01-11 NOTE — Discharge Instructions (Addendum)
You can apply the ointment twice daily as needed.  There is also a printed prescription for the antiviral if you notice any blistering or vesicles.   Please follow-up with your primary care.

## 2022-01-11 NOTE — ED Provider Notes (Signed)
[facility]    CSN: [id number] Arrival date & time: 01/11/22  0488     History   Chief Complaint Chief Complaint  Patient presents with   Rash    HPI Paige Diaz is a 66 y.o. female.  Woke up this morning with rash on right cheek.  Feels a slight burning sensation.  It is not painful or itchy.  Wanted to make sure it was not shingles. She denies fever, chills, changes in hearing, pain, rash anywhere else on the body. No new soaps, lotions, moisturizers.  No bites or scratches.  Has not tried anything for the symptoms.  Does have a history of shingles 40 years ago.  History reviewed. No pertinent past medical history.  There are no problems to display for this patient.   Past Surgical History:  Procedure Laterality Date   FOOT SURGERY     left    OB History   No obstetric history on file.      Home Medications    Prior to Admission medications   Medication Sig Start Date End Date Taking? Authorizing Provider  mupirocin ointment (BACTROBAN) 2 % Apply 1 application. topically 2 (two) times daily. 01/11/22  Yes Chaela Branscum, Lurena Joiner, PA-C  valACYclovir (VALTREX) 1000 MG tablet Take 1 tablet (1,000 mg total) by mouth 3 (three) times daily for 7 days. 01/11/22 01/18/22 Yes Yasmeen Manka, Lurena Joiner, PA-C  benzonatate (TESSALON) 100 MG capsule Take 1 capsule (100 mg total) by mouth 3 (three) times daily as needed for cough. 10/03/21   Avegno, Zachery Dakins, FNP  calcium carbonate (OS-CAL) 600 MG TABS Take 600 mg by mouth 2 (two) times daily with a meal.    [provider]  calcium citrate-vitamin D (CITRACAL+D) 315-200 MG-UNIT per tablet Take 1 tablet by mouth 2 (two) times daily.    [provider]  diclofenac sodium (VOLTAREN) 1 % GEL Apply topically 4 (four) times daily.    [provider]  ibuprofen (ADVIL,MOTRIN) 800 MG tablet Take 800 mg by mouth every 8 (eight) hours as needed.    [provider]  ipratropium (ATROVENT) 0.06 % nasal spray  Place 2 sprays into the nose 3 (three) times daily. 02/01/12 01/31/13  Sondra Barges, PA-C  magnesium gluconate (MAGONATE) 500 MG tablet Take 250 mg by mouth 2 (two) times daily.    [provider]  Multiple Vitamin (MULTIVITAMIN) tablet Take 1 tablet by mouth daily.    [provider]  pantoprazole (PROTONIX) 40 MG tablet TAKE 1 TABLET BY MOUTH ONCE DAILY 30 MINUTES BEFORE BREAKFAST 01/09/20 02/05/21  Celso Amy, PA-C  pantoprazole (PROTONIX) 40 MG tablet Take 1 tablet (40 mg total) by mouth daily 30 minutes before breakfast 12/08/21     triamcinolone cream (KENALOG) 0.1 % Apply topically to the affected areas 2 times a day 04/07/21     triamcinolone ointment (KENALOG) 0.1 % Apply to affected area twice daily as needed. 11/19/20     vitamin C (ASCORBIC ACID) 500 MG tablet Take 500 mg by mouth daily.    [provider]    Family History History reviewed. No pertinent family history.  Social History Social History   Tobacco Use   Smoking status: Never     Allergies   Codeine, Darvocet [propoxyphene n-acetaminophen], Penicillins, Percocet [oxycodone-acetaminophen], and Sulfa antibiotics   Review of Systems Review of Systems  Skin:  Positive for rash.  As per HPI  Physical Exam Triage Vital Signs ED Triage Vitals  Enc Vitals Group  BP 01/11/22 0858 (!) 142/81     Pulse Rate 01/11/22 0858 76     Resp 01/11/22 0858 16     Temp 01/11/22 0858 98.3 F (36.8 C)     Temp Source 01/11/22 0858 Oral     SpO2 01/11/22 0858 98 %     Weight --      Height --      Head Circumference --      Peak Flow --      Pain Score 01/11/22 1021 1     Pain Loc --      Pain Edu? --      Excl. in GC? --    No data found.  Updated Vital Signs BP (!) 142/81 (BP Location: Left Arm)   Pulse 76   Temp 98.3 F (36.8 C) (Oral)   Resp 16   SpO2 98%    Physical Exam Vitals and nursing note reviewed.  Constitutional:      General: She is not in acute distress. HENT:      Head:      Right Ear: Tympanic membrane, ear canal and external ear normal.     Left Ear: Tympanic membrane, ear canal and external ear normal.     Nose: Nose normal.     Mouth/Throat:     Mouth: Mucous membranes are moist.     Pharynx: Oropharynx is clear.  Eyes:     Conjunctiva/sclera: Conjunctivae normal.     Pupils: Pupils are equal, round, and reactive to light.  Cardiovascular:     Rate and Rhythm: Normal rate and regular rhythm.     Heart sounds: Normal heart sounds.  Pulmonary:     Effort: Pulmonary effort is normal.     Breath sounds: Normal breath sounds.  Lymphadenopathy:     Cervical: No cervical adenopathy.  Skin:    Findings: Rash present. No laceration or wound. Rash is macular.     Comments: There is a small macular, erythematous rash to the right cheek.  No blisters or vesicles noted.  No scaling or crusting, it is not weeping, not excoriated.  It is not hard or fluctuant. There is no extension into the forehead or chin  Neurological:     Mental Status: She is alert and oriented to person, place, and time.     UC Treatments / Results  Labs (all labs ordered are listed, but only abnormal results are displayed) Labs Reviewed - No data to display  EKG  Radiology No results found.  Procedures Procedures (including critical care time)  Medications Ordered in UC Medications - No data to display  Initial Impression / Assessment and Plan / UC Course  I have reviewed the triage vital signs and the nursing notes.  Pertinent labs & imaging results that were available during my care of the patient were reviewed by me and considered in my medical decision making (see chart for details).   Unknown etiology of rash.  At this time I will provide Bactroban ointment to use twice daily for prevention of skin infection.  Also provided printed prescription of Valtrex to use 3 times daily for 7 days if patient notices blisters or vesicles appear in the next 24-48 hours.   Do not believe this is shingles rash at this time but will provide prescription in case symptoms develop. Patient is also recommended to follow-up with her primary care if rash does not resolve. Otherwise physical exam is unremarkable. Return precautions discussed. Patient agrees to plan  and is discharged in stable condition.  Final Clinical Impressions(s) / UC Diagnoses   Final diagnoses:  Rash     Discharge Instructions      You can apply the ointment twice daily as needed.  There is also a printed prescription for the antiviral if you notice any blistering or vesicles.   Please follow-up with your primary care.    ED Prescriptions     Medication Sig Dispense Auth. Provider   mupirocin cream (BACTROBAN) 2 %  (Status: Discontinued) Apply 1 application. topically 2 (two) times daily. 15 g Nysir Fergusson, PA-C   valACYclovir (VALTREX) 1000 MG tablet Take 1 tablet (1,000 mg total) by mouth 3 (three) times daily for 7 days. 21 tablet Lynne Takemoto, PA-C   mupirocin ointment (BACTROBAN) 2 % Apply 1 application. topically 2 (two) times daily. 22 g Akshath Mccarey, Lurena Joiner, PA-C      PDMP not reviewed this encounter.   Madysen Faircloth, Ray Church 01/11/22 1135

## 2022-01-11 NOTE — ED Triage Notes (Signed)
Woke up this morning with a rash to left cheek. Hx of shingles, no shingles vaccine, requesting shingles rule out. Denies itching. Reports burning. No visible vesicles.

## 2022-02-22 ENCOUNTER — Ambulatory Visit: Payer: No Typology Code available for payment source | Admitting: Podiatry

## 2022-02-22 DIAGNOSIS — M79672 Pain in left foot: Secondary | ICD-10-CM

## 2022-02-22 DIAGNOSIS — Q828 Other specified congenital malformations of skin: Secondary | ICD-10-CM | POA: Diagnosis not present

## 2022-02-22 DIAGNOSIS — M79671 Pain in right foot: Secondary | ICD-10-CM

## 2022-02-22 NOTE — Patient Instructions (Signed)
You can use "urea cream" on the callus areas. Try to get at least 30% strength.   If was nice to meet you today. If you have any questions or any further concerns, please feel fee to give me a call. You can call our office at [phone] or please feel fee to send me a message through MyChart.

## 2022-02-23 NOTE — Progress Notes (Signed)
Subjective: 66 year old female presents the office today for concerns of calluses in the lateral aspect of both of her feet.  She is in the office before she had them trimmed and salicylic acid applied and helps temporarily but they come right back.  She states before they were 2 smaller spots but is more diffuse now.  There is 1 spot on the right foot that seems to be hurting more than others.  No open lesions or any swelling or redness or any drainage that she reports.  Objective: AAO x3, NAD DP/PT pulses palpable bilaterally, CRT less than 3 seconds On the lateral aspect the foot on the fifth metatarsal base bilaterally there is hyperkeratotic tissue.  The left side is more diffuse on the right side is more focal.  There is no underlying ulceration drainage or any signs of infection.  No open lesions.  No drainage or pus. No pain with calf compression, swelling, warmth, erythema  Assessment: Hyperkeratotic lesions bilaterally  Plan: -All treatment options discussed with the patient including all alternatives, risks, complications.  -Prematurity lesions with any complications or bleeding.  We discussed different moisturizers including urea cream to apply topically.  Continue supportive shoe gear to avoid pressure. -Patient encouraged to call the office with any questions, concerns, change in symptoms.   Paige Diaz DPM

## 2022-03-01 ENCOUNTER — Other Ambulatory Visit (HOSPITAL_COMMUNITY): Payer: Self-pay

## 2022-04-14 ENCOUNTER — Other Ambulatory Visit (HOSPITAL_COMMUNITY): Payer: Self-pay

## 2022-04-14 MED ORDER — NEOMYCIN-POLYMYXIN-DEXAMETH 3.5-10000-0.1 OP OINT
TOPICAL_OINTMENT | OPHTHALMIC | 1 refills | Status: AC
  Filled 2022-04-14: qty 3.5, 14d supply, fill #0

## 2022-05-03 ENCOUNTER — Other Ambulatory Visit (HOSPITAL_COMMUNITY): Payer: Self-pay

## 2022-05-04 IMAGING — MR MR LUMBAR SPINE WO/W CM
4 of 7 series · 23 of 48 positions shown · IV contrast (17ml multihance)
Comparison: None.

CLINICAL DATA: Low back pain.

EXAM:
MRI LUMBAR SPINE WITHOUT AND WITH CONTRAST
TECHNIQUE: Multiplanar and multiecho pulse sequences of the lumbar spine were
obtained without and with intravenous contrast.
CONTRAST:  17mL MULTIHANCE GADOBENATE DIMEGLUMINE 529 MG/ML IV SOLN

[Series 3: T2 · sagittal · 4.0mm · 1.09mm/px · 3 of 15 slices shown (1 of 2)]
[im 1/15]
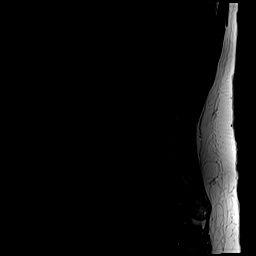
[im 8/15]
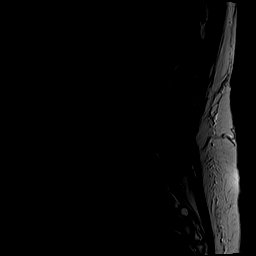
[im 15/15]
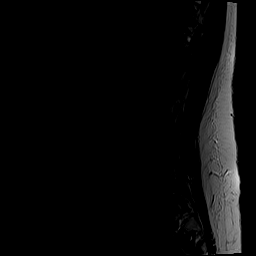

[Series 4: T1 · sagittal · 4.0mm · 0.88mm/px · 4 of 15 slices shown (1 of 2)]
[im 1/15]
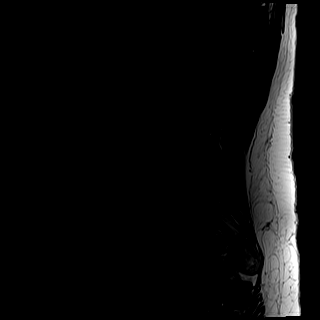
[im 5/15]
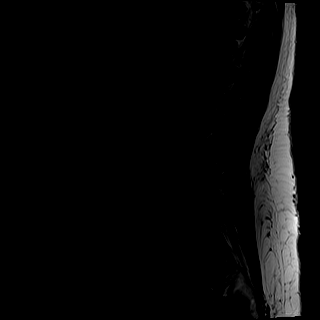
[im 10/15]
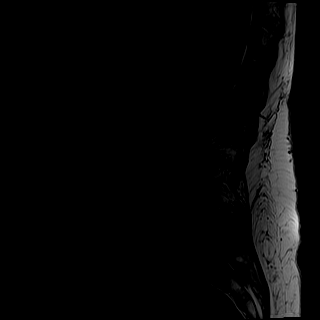
[im 15/15]
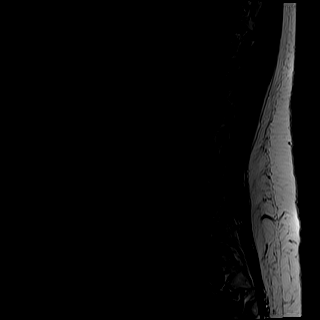

[Series 6: T2 · axial · 4.0mm · 0.39mm/px · z∈[-84,+141]mm · 11 of 40 slices shown (2 of 2)]
[im 1/40]
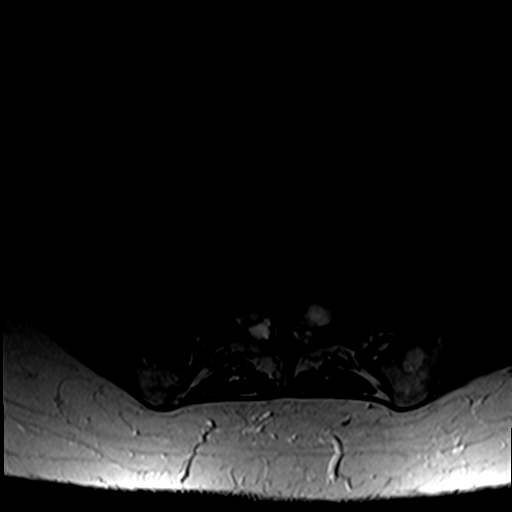
[im 4/40]
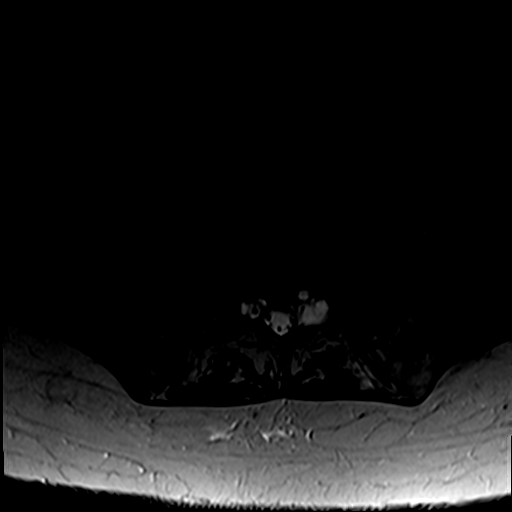
[im 8/40]
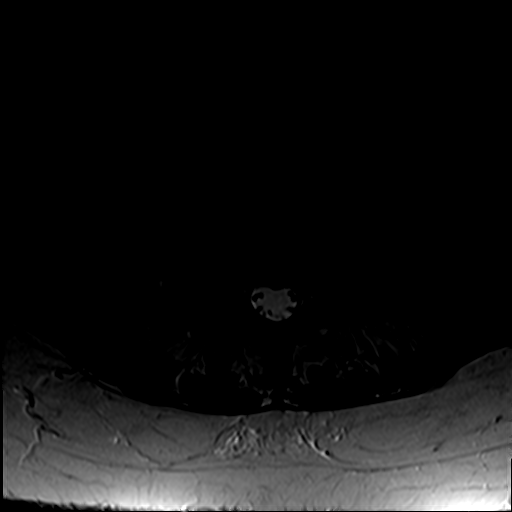
[im 12/40]
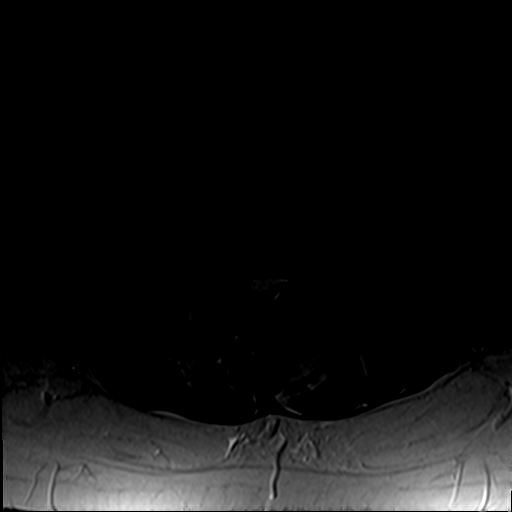
[im 16/40]
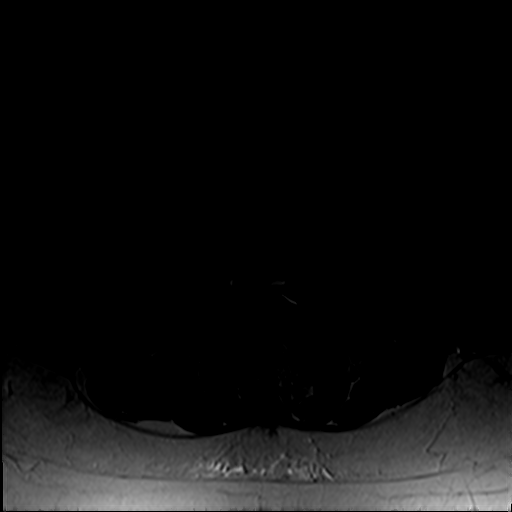
[im 20/40]
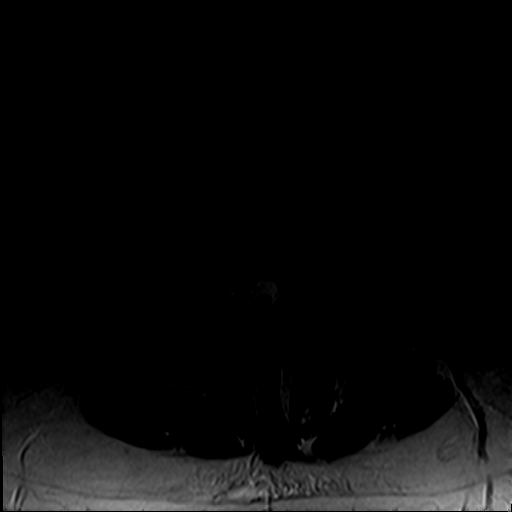
[im 24/40]
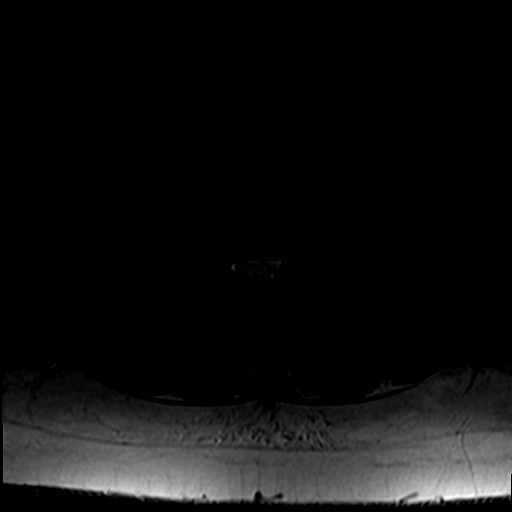
[im 28/40]
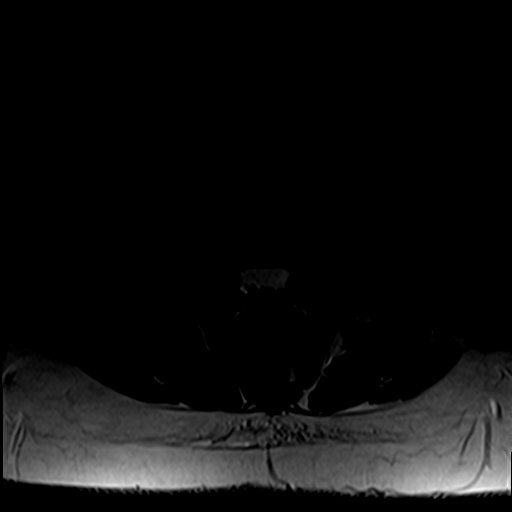
[im 32/40]
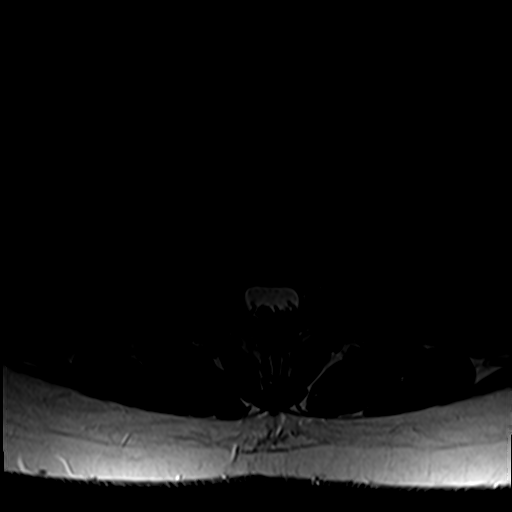
[im 36/40]
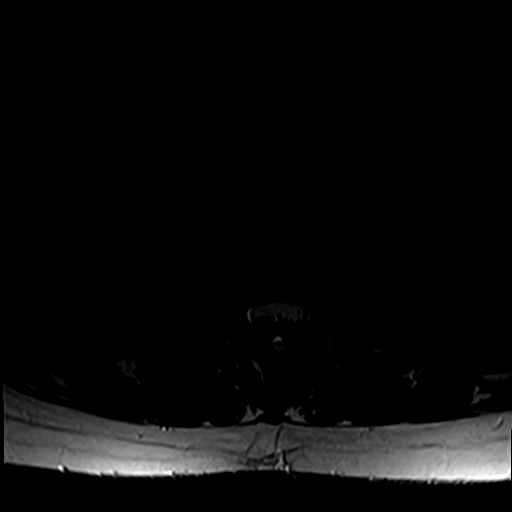
[im 40/40]
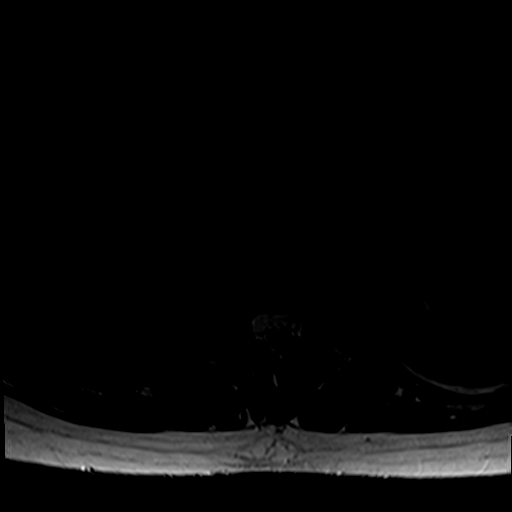

[Series 7: T1 · axial · 4.0mm · 0.39mm/px · z∈[-84,+122]mm · 5 of 40 slices shown (2 of 2)]
[im 1/40]
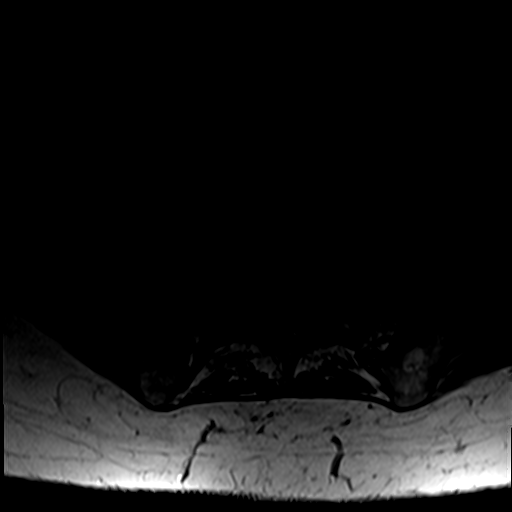
[im 4/40]
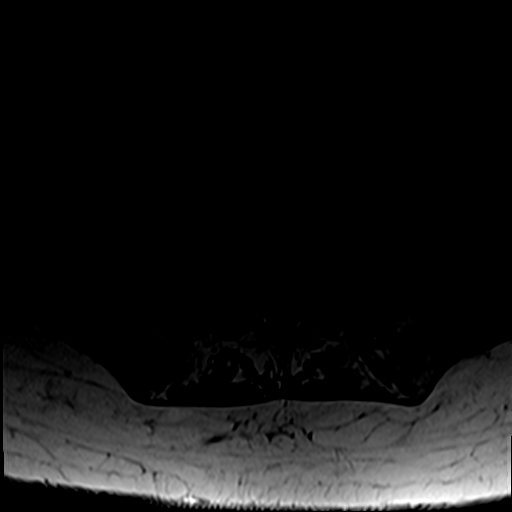
[im 8/40]
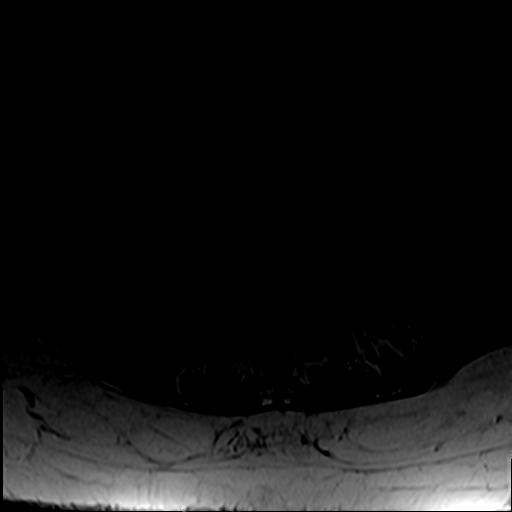
[im 20/40]
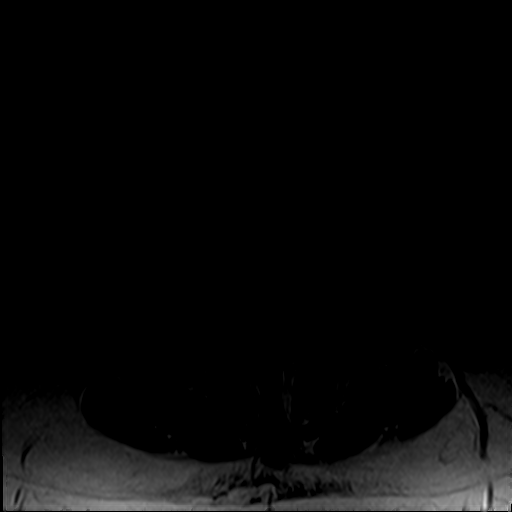
[im 36/40]
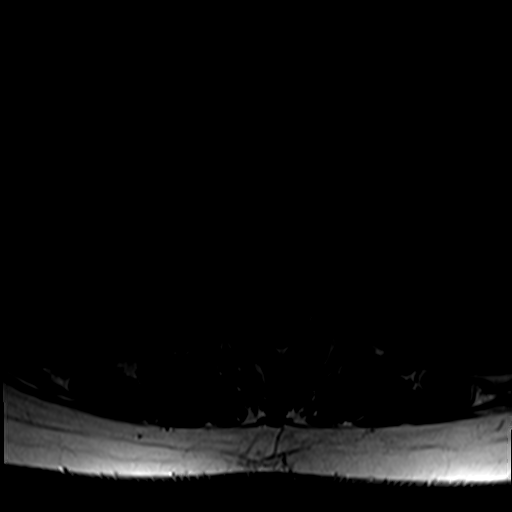

[23 of 48 positions shown; findings below may reference images not displayed]

FINDINGS: Segmentation: A transitional lumbosacral vertebra is assumed to
represent a partially sacralized L5 level. Careful correlation with
this numbering strategy prior to any procedural intervention would
be recommended.

Alignment: Grade 1 anterolisthesis of L3 over L4 and L4 over L5.
Trace retrolisthesis of T12 over L1, L1 over L2 and L2 over L3.
Dextroconvex scoliosis of the lumbar spine.

Vertebrae:  No fracture, evidence of discitis, or bone lesion.

Conus medullaris and cauda equina: Conus extends to the L1 level.
Conus and cauda equina appear normal.

Paraspinal and other soft tissues: Negative.

Disc levels:

T12-L1: No spinal canal or neural foraminal stenosis.

L1-2: Disc bulge, moderate facet degenerative changes with bilateral
joint effusion. Mild right neural foraminal narrowing. No spinal
canal stenosis.

L2-3: Disc bulge, moderate facet degenerative changes with mild
bilateral joint effusion and mild ligamentum flavum redundancy
resulting in mild bilateral neural foraminal narrowing. No
significant spinal canal stenosis.

L3-4: Disc bulge, hypertrophic facet degenerative change ligamentum
flavum redundancy resulting in mild spinal canal stenosis narrowing
of the bilateral subarticular zones, mild right and mild to moderate
left neural foraminal narrowing.

L4-5: Disc bulge, moderate facet degenerative change ligamentum
flavum redundancy resulting in mild spinal canal stenosis with
narrowing of the bilateral subarticular zones and moderate bilateral
neural foraminal narrowing.

L5-S1: No spinal canal or neural foraminal stenosis.
IMPRESSION: 1. Degenerative changes of the lumbar spine, more pronounced at the
level of the facet joints, particularly at the L3-4 and L4-5 levels.
2. Mild spinal canal stenosis with mild narrowing of the bilateral
subarticular zones at L3 and L4-5.
3. Moderate bilateral neural foraminal narrowing at L4-5 and mild to
moderate on the left at L3-4.

## 2022-05-17 ENCOUNTER — Other Ambulatory Visit (HOSPITAL_COMMUNITY): Payer: Self-pay

## 2022-05-17 MED ORDER — IBUPROFEN 800 MG PO TABS
ORAL_TABLET | ORAL | 2 refills | Status: AC
  Filled 2022-05-17: qty 180, 90d supply, fill #0
  Filled 2022-11-03: qty 180, 90d supply, fill #1
  Filled 2023-04-29: qty 180, 90d supply, fill #2

## 2022-05-18 ENCOUNTER — Other Ambulatory Visit (HOSPITAL_COMMUNITY): Payer: Self-pay

## 2022-05-18 MED ORDER — IBUPROFEN 800 MG PO TABS
800.0000 mg | ORAL_TABLET | Freq: Two times a day (BID) | ORAL | 1 refills | Status: AC
  Filled 2022-05-18: qty 60, 30d supply, fill #0

## 2022-05-21 ENCOUNTER — Other Ambulatory Visit (HOSPITAL_COMMUNITY): Payer: Self-pay

## 2022-08-13 ENCOUNTER — Other Ambulatory Visit (HOSPITAL_COMMUNITY): Payer: Self-pay

## 2022-10-12 DIAGNOSIS — Z6830 Body mass index (BMI) 30.0-30.9, adult: Secondary | ICD-10-CM | POA: Diagnosis not present

## 2022-10-12 DIAGNOSIS — M4316 Spondylolisthesis, lumbar region: Secondary | ICD-10-CM | POA: Diagnosis not present

## 2022-10-13 DIAGNOSIS — H10411 Chronic giant papillary conjunctivitis, right eye: Secondary | ICD-10-CM | POA: Diagnosis not present

## 2022-10-13 DIAGNOSIS — Z961 Presence of intraocular lens: Secondary | ICD-10-CM | POA: Diagnosis not present

## 2022-10-18 ENCOUNTER — Other Ambulatory Visit (HOSPITAL_COMMUNITY): Payer: Self-pay

## 2022-10-18 MED ORDER — NEOMYCIN-POLYMYXIN-DEXAMETH 3.5-10000-0.1 OP SUSP
1.0000 [drp] | Freq: Four times a day (QID) | OPHTHALMIC | 1 refills | Status: AC
  Filled 2022-10-18: qty 5, 7d supply, fill #0
  Filled 2023-05-12 – 2023-06-29 (×3): qty 5, 25d supply, fill #0

## 2022-10-21 ENCOUNTER — Other Ambulatory Visit (HOSPITAL_COMMUNITY): Payer: Self-pay

## 2022-10-22 ENCOUNTER — Other Ambulatory Visit (HOSPITAL_COMMUNITY): Payer: Self-pay

## 2022-11-03 ENCOUNTER — Other Ambulatory Visit (HOSPITAL_COMMUNITY): Payer: Self-pay

## 2022-11-17 DIAGNOSIS — M545 Low back pain, unspecified: Secondary | ICD-10-CM | POA: Diagnosis not present

## 2022-11-29 DIAGNOSIS — M545 Low back pain, unspecified: Secondary | ICD-10-CM | POA: Diagnosis not present

## 2022-11-30 ENCOUNTER — Other Ambulatory Visit (HOSPITAL_COMMUNITY): Payer: Self-pay

## 2022-11-30 DIAGNOSIS — J358 Other chronic diseases of tonsils and adenoids: Secondary | ICD-10-CM | POA: Diagnosis not present

## 2022-11-30 DIAGNOSIS — J029 Acute pharyngitis, unspecified: Secondary | ICD-10-CM | POA: Diagnosis not present

## 2022-11-30 DIAGNOSIS — B354 Tinea corporis: Secondary | ICD-10-CM | POA: Diagnosis not present

## 2022-11-30 MED ORDER — KETOCONAZOLE 2 % EX CREA
TOPICAL_CREAM | CUTANEOUS | 0 refills | Status: DC
  Filled 2022-11-30: qty 15, 14d supply, fill #0

## 2022-12-15 DIAGNOSIS — M545 Low back pain, unspecified: Secondary | ICD-10-CM | POA: Diagnosis not present

## 2022-12-17 ENCOUNTER — Other Ambulatory Visit (HOSPITAL_COMMUNITY): Payer: Self-pay

## 2022-12-17 MED ORDER — KETOCONAZOLE 2 % EX CREA
TOPICAL_CREAM | CUTANEOUS | 2 refills | Status: AC
  Filled 2022-12-17: qty 15, 30d supply, fill #0
  Filled 2023-06-29: qty 15, 30d supply, fill #1
  Filled 2023-08-19 (×2): qty 15, 30d supply, fill #2

## 2022-12-24 ENCOUNTER — Other Ambulatory Visit (HOSPITAL_COMMUNITY): Payer: Self-pay

## 2023-01-12 DIAGNOSIS — M545 Low back pain, unspecified: Secondary | ICD-10-CM | POA: Diagnosis not present

## 2023-03-23 DIAGNOSIS — M545 Low back pain, unspecified: Secondary | ICD-10-CM | POA: Diagnosis not present

## 2023-04-27 DIAGNOSIS — M1811 Unilateral primary osteoarthritis of first carpometacarpal joint, right hand: Secondary | ICD-10-CM | POA: Diagnosis not present

## 2023-04-29 ENCOUNTER — Other Ambulatory Visit (HOSPITAL_COMMUNITY): Payer: Self-pay

## 2023-05-12 ENCOUNTER — Other Ambulatory Visit (HOSPITAL_COMMUNITY): Payer: Self-pay

## 2023-05-17 ENCOUNTER — Other Ambulatory Visit (HOSPITAL_COMMUNITY): Payer: Self-pay | Admitting: Internal Medicine

## 2023-05-17 ENCOUNTER — Other Ambulatory Visit (HOSPITAL_COMMUNITY): Payer: Self-pay

## 2023-05-17 DIAGNOSIS — E7800 Pure hypercholesterolemia, unspecified: Secondary | ICD-10-CM | POA: Diagnosis not present

## 2023-05-17 DIAGNOSIS — M8589 Other specified disorders of bone density and structure, multiple sites: Secondary | ICD-10-CM | POA: Diagnosis not present

## 2023-05-17 DIAGNOSIS — Z79899 Other long term (current) drug therapy: Secondary | ICD-10-CM | POA: Diagnosis not present

## 2023-05-17 DIAGNOSIS — Z0000 Encounter for general adult medical examination without abnormal findings: Secondary | ICD-10-CM | POA: Diagnosis not present

## 2023-05-17 DIAGNOSIS — K219 Gastro-esophageal reflux disease without esophagitis: Secondary | ICD-10-CM | POA: Diagnosis not present

## 2023-05-17 DIAGNOSIS — R7301 Impaired fasting glucose: Secondary | ICD-10-CM | POA: Diagnosis not present

## 2023-05-17 DIAGNOSIS — K9089 Other intestinal malabsorption: Secondary | ICD-10-CM | POA: Diagnosis not present

## 2023-05-17 DIAGNOSIS — L304 Erythema intertrigo: Secondary | ICD-10-CM | POA: Diagnosis not present

## 2023-05-17 DIAGNOSIS — M545 Low back pain, unspecified: Secondary | ICD-10-CM | POA: Diagnosis not present

## 2023-05-17 MED ORDER — PANTOPRAZOLE SODIUM 20 MG PO TBEC
20.0000 mg | DELAYED_RELEASE_TABLET | Freq: Every day | ORAL | 2 refills | Status: AC
  Filled 2023-05-17: qty 30, 30d supply, fill #0
  Filled 2023-08-19 (×2): qty 30, 30d supply, fill #1
  Filled 2023-11-22: qty 30, 30d supply, fill #2

## 2023-05-19 ENCOUNTER — Other Ambulatory Visit (HOSPITAL_COMMUNITY): Payer: Self-pay

## 2023-05-25 ENCOUNTER — Other Ambulatory Visit (HOSPITAL_COMMUNITY): Payer: Self-pay

## 2023-05-25 MED ORDER — KETOCONAZOLE 2 % EX CREA
TOPICAL_CREAM | CUTANEOUS | 0 refills | Status: AC
  Filled 2023-05-25: qty 15, 14d supply, fill #0

## 2023-05-25 MED ORDER — TRIAMCINOLONE ACETONIDE 0.1 % EX CREA
TOPICAL_CREAM | CUTANEOUS | 3 refills | Status: AC
  Filled 2023-05-25: qty 30, 7d supply, fill #0
  Filled 2023-08-19: qty 30, 7d supply, fill #1

## 2023-06-16 ENCOUNTER — Encounter: Payer: Self-pay | Admitting: Podiatry

## 2023-06-16 ENCOUNTER — Ambulatory Visit (INDEPENDENT_AMBULATORY_CARE_PROVIDER_SITE_OTHER): Payer: 59

## 2023-06-16 ENCOUNTER — Ambulatory Visit (INDEPENDENT_AMBULATORY_CARE_PROVIDER_SITE_OTHER): Payer: 59 | Admitting: Podiatry

## 2023-06-16 DIAGNOSIS — M778 Other enthesopathies, not elsewhere classified: Secondary | ICD-10-CM

## 2023-06-16 DIAGNOSIS — Q828 Other specified congenital malformations of skin: Secondary | ICD-10-CM

## 2023-06-16 DIAGNOSIS — T148XXA Other injury of unspecified body region, initial encounter: Secondary | ICD-10-CM

## 2023-06-16 NOTE — Progress Notes (Signed)
Subjective: Chief Complaint  Patient presents with   Nail Problem    Right big toe nail problem discoloration and sensitive    67 year old female presents the office today for concerns of bruising to her right big toe.  Is not under the toenail plate around her toe mostly lateral aspect.  She does not recall any injuries, change in shoes or any thing out of the ordinary.  Tenderness has improved but she still does have it checked.    Secondarily she like to have her calluses trimmed as they are causing discomfort.  No open lesions.    Objective: AAO x3, NAD DP/PT pulses palpable bilaterally, CRT less than 3 seconds; toes are warm and perfused. There is bruising present on the right hallux mostly lateral aspect but on the dorsal aspect just proximal nail fold as well.  There is no blood underneath the toenail itself there is no pain to the nail itself either.  There is no open lesions.  There is no other areas of discomfort or bruising. On the lateral aspect the foot on the fifth metatarsal base bilaterally there is hyperkeratotic tissue.  The left side is more diffuse on the right side is more focal.  There is no underlying ulceration drainage or any signs of infection.  No open lesions.  No drainage or pus. No pain with calf compression, swelling, warmth, erythema  Assessment: Bruising right hallux, hyperkeratotic lesions bilaterally  Plan: -All treatment options discussed with the patient including all alternatives, risks, complications.  -X-rays obtained reviewed.  2 views of the foot were obtained.  No evidence of acute fracture noted. -Not fully able to explain why the bruising occurred.  Discussed possible microtrauma.  I do not think is circulatory in nature.  Will continue to monitor.  Symptoms are already improving.  Should he not improve or if this were to recur let me know. -Sharply debrided the hyperkeratotic lesions with any complications or bleeding.  We discussed different  moisturizers including urea cream to apply topically.  Continue supportive shoe gear to avoid pressure. -Patient encouraged to call the office with any questions, concerns, change in symptoms.   Paige Diaz DPM

## 2023-06-23 DIAGNOSIS — M1811 Unilateral primary osteoarthritis of first carpometacarpal joint, right hand: Secondary | ICD-10-CM | POA: Diagnosis not present

## 2023-06-29 ENCOUNTER — Other Ambulatory Visit (HOSPITAL_COMMUNITY): Payer: Self-pay

## 2023-06-30 ENCOUNTER — Other Ambulatory Visit: Payer: Self-pay

## 2023-06-30 ENCOUNTER — Other Ambulatory Visit (HOSPITAL_COMMUNITY): Payer: Self-pay

## 2023-07-01 ENCOUNTER — Other Ambulatory Visit (HOSPITAL_COMMUNITY): Payer: Self-pay

## 2023-07-01 ENCOUNTER — Other Ambulatory Visit: Payer: Self-pay

## 2023-07-29 ENCOUNTER — Other Ambulatory Visit (HOSPITAL_COMMUNITY): Payer: Self-pay

## 2023-07-29 MED ORDER — FLUCONAZOLE 200 MG PO TABS
ORAL_TABLET | ORAL | 0 refills | Status: DC
  Filled 2023-07-29: qty 4, 28d supply, fill #0

## 2023-08-19 ENCOUNTER — Other Ambulatory Visit (HOSPITAL_COMMUNITY): Payer: Self-pay

## 2023-10-14 ENCOUNTER — Other Ambulatory Visit (HOSPITAL_COMMUNITY): Payer: Self-pay

## 2023-10-14 MED ORDER — FLUCONAZOLE 200 MG PO TABS
200.0000 mg | ORAL_TABLET | ORAL | 3 refills | Status: AC
  Filled 2023-10-14: qty 4, 28d supply, fill #0

## 2023-10-24 ENCOUNTER — Ambulatory Visit
Admission: EM | Admit: 2023-10-24 | Discharge: 2023-10-24 | Disposition: A | Discharge status: Home / Self Care | Attending: Family Medicine | Admitting: Family Medicine

## 2023-10-24 DIAGNOSIS — M79661 Pain in right lower leg: Secondary | ICD-10-CM | POA: Diagnosis not present

## 2023-10-24 DIAGNOSIS — M7989 Other specified soft tissue disorders: Secondary | ICD-10-CM

## 2023-10-24 NOTE — ED Triage Notes (Signed)
 Pt c/o RLE calf pain x 4-5 days-denies injury-NAD-steady gait

## 2023-10-24 NOTE — ED Provider Notes (Signed)
 [facility]  Note:  This document was prepared using Conservation officer, historic buildings and may include unintentional dictation errors.  MRN: [id number] DOB: 1956/03/28  Subjective:   Paige Diaz is a 68 y.o. female presenting for 4 to 5-day history of persistent right lower leg pain.  Reports bilateral lower leg swelling but feels pain in her right lower leg.  Has concerns about DVT.  Has a sedentary job, wears low level compression socks.  No warmth, erythema.  No history of clotting disorders.  No chest pain, shortness of breath or heart racing.  No current facility-administered medications for this encounter.  Current Outpatient Medications:    benzonatate (TESSALON) 100 MG capsule, Take 1 capsule (100 mg total) by mouth 3 (three) times daily as needed for cough., Disp: 30 capsule, Rfl: 0   calcium carbonate (OS-CAL) 600 MG TABS, Take 600 mg by mouth 2 (two) times daily with a meal., Disp: , Rfl:    calcium citrate-vitamin D (CITRACAL+D) 315-200 MG-UNIT per tablet, Take 1 tablet by mouth 2 (two) times daily., Disp: , Rfl:    diclofenac sodium (VOLTAREN) 1 % GEL, Apply topically 4 (four) times daily., Disp: , Rfl:    fluconazole (DIFLUCAN) 200 MG tablet, Take 1 tablet (200 mg total) by mouth once a week., Disp: 4 tablet, Rfl: 3   ibuprofen (ADVIL) 800 MG tablet, Take 1 tablet twice daily as needed., Disp: 180 tablet, Rfl: 2   ibuprofen (ADVIL) 800 MG tablet, Take 1 tablet (800 mg total) by mouth 2 (two) times daily if needed, Disp: 60 tablet, Rfl: 1   ibuprofen (ADVIL,MOTRIN) 800 MG tablet, Take 800 mg by mouth every 8 (eight) hours as needed., Disp: , Rfl:    ipratropium (ATROVENT) 0.06 % nasal spray, Place 2 sprays into the nose 3 (three) times daily., Disp: 15 mL, Rfl: 0   ketoconazole (NIZORAL) 2 % cream, Apply externally twice a day for 14 days, Disp: 15 g, Rfl: 2   ketoconazole (NIZORAL) 2 % cream, Apply to affected area 2 times a day for 14 days, Disp: 15  g, Rfl: 0   magnesium gluconate (MAGONATE) 500 MG tablet, Take 250 mg by mouth 2 (two) times daily., Disp: , Rfl:    Multiple Vitamin (MULTIVITAMIN) tablet, Take 1 tablet by mouth daily., Disp: , Rfl:    mupirocin ointment (BACTROBAN) 2 %, Apply 1 application. topically 2 (two) times daily., Disp: 22 g, Rfl: 0   neomycin-polymyxin b-dexamethasone (MAXITROL) 3.5-10000-0.1 OINT, Apply into both eyes twice daily for 1 week then use once daily for 1 week, Disp: 3.5 g, Rfl: 1   neomycin-polymyxin b-dexamethasone (MAXITROL) 3.5-10000-0.1 SUSP, Place 1 drop into the right eye 4 (four) times daily for 1 week, Disp: 5 mL, Rfl: 1   pantoprazole (PROTONIX) 20 MG tablet, Take 1 tablet (20 mg total) by mouth daily., Disp: 30 tablet, Rfl: 2   pantoprazole (PROTONIX) 40 MG tablet, TAKE 1 TABLET BY MOUTH ONCE DAILY 30 MINUTES BEFORE BREAKFAST, Disp: 30 tablet, Rfl: 12   pantoprazole (PROTONIX) 40 MG tablet, Take 1 tablet (40 mg total) by mouth daily 30 minutes before breakfast, Disp: 90 tablet, Rfl: 3   triamcinolone cream (KENALOG) 0.1 %, Apply topically to the affected areas 2 times a day, Disp: 30 g, Rfl: 3   triamcinolone cream (KENALOG) 0.1 %, APPLY A THIN LAYER TO THE AFFECTED AREA(S) 2 TIMES PER DAY, Disp: 30 g, Rfl: 3   triamcinolone ointment (KENALOG) 0.1 %, Apply to  affected area twice daily as needed., Disp: 30 g, Rfl: 1   vitamin C (ASCORBIC ACID) 500 MG tablet, Take 500 mg by mouth daily., Disp: , Rfl:    Allergies  Allergen Reactions   Codeine Hives and Nausea Only   Darvocet [Propoxyphene N-Acetaminophen] Hives and Nausea Only   Penicillins Hives and Nausea Only   Percocet [Oxycodone-Acetaminophen] Hives and Nausea Only   Sulfa Antibiotics Hives and Nausea Only    History reviewed. No pertinent past medical history.   Past Surgical History:  Procedure Laterality Date   FOOT SURGERY     left    No family history on file.  Social History   Tobacco Use   Smoking status: Never    Smokeless tobacco: Never  Vaping Use   Vaping status: Never Used  Substance Use Topics   Alcohol use: Not Currently   Drug use: Not Currently    ROS   Objective:   Vitals: BP (!) 149/86 (BP Location: Left Arm)   Pulse 65   Temp 97.9 F (36.6 C) (Oral)   Resp 20   SpO2 98%   Physical Exam Constitutional:      General: She is not in acute distress.    Appearance: Normal appearance. She is well-developed. She is not ill-appearing, toxic-appearing or diaphoretic.  HENT:     Head: Normocephalic and atraumatic.     Nose: Nose normal.     Mouth/Throat:     Mouth: Mucous membranes are moist.  Eyes:     General: No scleral icterus.       Right eye: No discharge.        Left eye: No discharge.     Extraocular Movements: Extraocular movements intact.  Cardiovascular:     Rate and Rhythm: Normal rate.  Pulmonary:     Effort: Pulmonary effort is normal.  Musculoskeletal:     Right lower leg: Swelling and tenderness (distal medial posterior and lateral aspect) present. No deformity, lacerations or bony tenderness. No edema.     Left lower leg: Swelling present. No deformity, lacerations, tenderness or bony tenderness. No edema.     Comments: Negative Denna Haggard' sign bilaterally.  Skin:    General: Skin is warm and dry.  Neurological:     General: No focal deficit present.     Mental Status: She is alert and oriented to person, place, and time.  Psychiatric:        Mood and Affect: Mood normal.        Behavior: Behavior normal.     Assessment and Plan :   PDMP not reviewed this encounter.  1. Pain and swelling of lower leg, right   2. Left leg swelling    Had an extensive discussion with patient about differential.  Ultimately I recommended pursuing an ultrasound to rule out DVT of the right lower extremity.  Given that it was after hours, placed a future order for a DVT ultrasound.  Patient is to have this completed today.   Wallis Bamberg, PA-C 10/25/23 0830

## 2023-10-24 NOTE — Discharge Instructions (Addendum)
 I have placed orders for you to go the [facility] tomorrow.  The orders are for an ultrasound to rule out a DVT study.  The current policy is to present through the emergency room not to be seen as an ER patient but so that they notify the ultrasound technologist that you are there for your ultrasound.  You can also try calling first thing tomorrow at [phone]. This is important to schedule your ultrasound as soon as possible to rule out a dvt.  They would notify us of your results and then I would call them to you.  Otherwise, I recommend wearing compression socks of the pressure between 10 mmHg to 20 mmHg.  Also recommend elevating your legs when you get home.

## 2023-10-25 ENCOUNTER — Other Ambulatory Visit (HOSPITAL_COMMUNITY): Payer: Self-pay

## 2023-10-25 ENCOUNTER — Other Ambulatory Visit: Payer: Self-pay

## 2023-10-27 DIAGNOSIS — S86811A Strain of other muscle(s) and tendon(s) at lower leg level, right leg, initial encounter: Secondary | ICD-10-CM | POA: Diagnosis not present

## 2023-10-28 ENCOUNTER — Ambulatory Visit: Admitting: Podiatry

## 2023-10-28 DIAGNOSIS — M79671 Pain in right foot: Secondary | ICD-10-CM

## 2023-10-28 DIAGNOSIS — Q828 Other specified congenital malformations of skin: Secondary | ICD-10-CM | POA: Diagnosis not present

## 2023-10-28 DIAGNOSIS — M79672 Pain in left foot: Secondary | ICD-10-CM

## 2023-10-28 NOTE — Progress Notes (Signed)
 Subjective: Chief Complaint  Patient presents with   RFC    RM#11 RFC patient has no concerns at this time.   68 year old female presents the office today with above concerns.  She said the calluses have gotten sick in the past and quite a bit discomfort.  She does not report any open lesions or any drainage.  The bruising she had previously has resolved.  No new concerns.   Objective: AAO x3, NAD DP/PT pulses palpable bilaterally, CRT less than 3 seconds; toes are warm and perfused. On the lateral aspect the foot on the fifth metatarsal base bilaterally there is hyperkeratotic tissue.  The left side is more diffuse on the right side is more focal.  There is no underlying ulceration drainage or any signs of infection.  No open lesions.  No drainage or pus. No pain with calf compression, swelling, warmth, erythema  Assessment: Hyperkeratotic lesions bilaterally  Plan: -All treatment options discussed with the patient including all alternatives, risks, complications.  -Sharply debrided the hyperkeratotic lesions with any complications or bleeding.  We discussed different moisturizers including urea cream to apply topically.  Continue supportive shoe gear to offload. -Patient encouraged to call the office with any questions, concerns, change in symptoms.   Vivi Barrack DPM

## 2023-11-11 ENCOUNTER — Ambulatory Visit: Admitting: Podiatry

## 2023-11-21 ENCOUNTER — Other Ambulatory Visit (HOSPITAL_COMMUNITY): Payer: Self-pay

## 2023-11-22 ENCOUNTER — Other Ambulatory Visit (HOSPITAL_COMMUNITY): Payer: Self-pay

## 2023-11-22 ENCOUNTER — Other Ambulatory Visit: Payer: Self-pay

## 2023-11-22 MED ORDER — PANTOPRAZOLE SODIUM 20 MG PO TBEC
20.0000 mg | DELAYED_RELEASE_TABLET | Freq: Every day | ORAL | 2 refills | Status: DC
Start: 2023-11-22 — End: 2024-02-24
  Filled 2023-11-22 – 2024-01-07 (×2): qty 30, 30d supply, fill #0
  Filled 2024-02-06: qty 30, 30d supply, fill #1
  Filled 2024-02-20: qty 30, 30d supply, fill #2

## 2023-12-03 ENCOUNTER — Other Ambulatory Visit (HOSPITAL_COMMUNITY): Payer: Self-pay

## 2024-01-07 ENCOUNTER — Other Ambulatory Visit (HOSPITAL_COMMUNITY): Payer: Self-pay

## 2024-01-31 ENCOUNTER — Ambulatory Visit: Payer: Self-pay | Admitting: Dermatology

## 2024-02-13 DIAGNOSIS — R600 Localized edema: Secondary | ICD-10-CM | POA: Diagnosis not present

## 2024-02-13 DIAGNOSIS — K219 Gastro-esophageal reflux disease without esophagitis: Secondary | ICD-10-CM | POA: Diagnosis not present

## 2024-02-13 DIAGNOSIS — R351 Nocturia: Secondary | ICD-10-CM | POA: Diagnosis not present

## 2024-02-20 ENCOUNTER — Other Ambulatory Visit (HOSPITAL_COMMUNITY): Payer: Self-pay

## 2024-02-24 ENCOUNTER — Other Ambulatory Visit (HOSPITAL_COMMUNITY): Payer: Self-pay

## 2024-02-24 MED ORDER — PANTOPRAZOLE SODIUM 20 MG PO TBEC
20.0000 mg | DELAYED_RELEASE_TABLET | Freq: Every day | ORAL | 0 refills | Status: DC
  Filled 2024-02-24: qty 180, 90d supply, fill #0
  Filled 2024-02-25: qty 174, 87d supply, fill #0
  Filled 2024-02-25: qty 6, 3d supply, fill #0

## 2024-02-25 ENCOUNTER — Other Ambulatory Visit (HOSPITAL_COMMUNITY): Payer: Self-pay

## 2024-02-27 ENCOUNTER — Other Ambulatory Visit (HOSPITAL_COMMUNITY): Payer: Self-pay

## 2024-02-27 ENCOUNTER — Other Ambulatory Visit: Payer: Self-pay

## 2024-03-02 ENCOUNTER — Other Ambulatory Visit (HOSPITAL_COMMUNITY): Payer: Self-pay

## 2024-03-29 ENCOUNTER — Other Ambulatory Visit (HOSPITAL_COMMUNITY): Payer: Self-pay

## 2024-03-29 MED ORDER — IBUPROFEN 800 MG PO TABS
800.0000 mg | ORAL_TABLET | Freq: Two times a day (BID) | ORAL | 0 refills | Status: DC | PRN
  Filled 2024-03-29: qty 60, 30d supply, fill #0

## 2024-04-11 ENCOUNTER — Other Ambulatory Visit (HOSPITAL_COMMUNITY): Payer: Self-pay

## 2024-05-05 ENCOUNTER — Other Ambulatory Visit (HOSPITAL_COMMUNITY): Payer: Self-pay

## 2024-05-05 DIAGNOSIS — M79671 Pain in right foot: Secondary | ICD-10-CM | POA: Diagnosis not present

## 2024-05-05 MED ORDER — PREDNISONE 10 MG (21) PO TBPK
ORAL_TABLET | ORAL | 0 refills | Status: DC
  Filled 2024-05-05: qty 21, 6d supply, fill #0

## 2024-05-16 ENCOUNTER — Encounter: Payer: Self-pay | Admitting: *Deleted

## 2024-05-24 DIAGNOSIS — Z1231 Encounter for screening mammogram for malignant neoplasm of breast: Secondary | ICD-10-CM | POA: Diagnosis not present

## 2024-05-24 DIAGNOSIS — Z6830 Body mass index (BMI) 30.0-30.9, adult: Secondary | ICD-10-CM | POA: Diagnosis not present

## 2024-05-24 DIAGNOSIS — M8580 Other specified disorders of bone density and structure, unspecified site: Secondary | ICD-10-CM | POA: Diagnosis not present

## 2024-05-24 DIAGNOSIS — Z780 Asymptomatic menopausal state: Secondary | ICD-10-CM | POA: Diagnosis not present

## 2024-05-24 DIAGNOSIS — Z01419 Encounter for gynecological examination (general) (routine) without abnormal findings: Secondary | ICD-10-CM | POA: Diagnosis not present

## 2024-06-18 ENCOUNTER — Other Ambulatory Visit (HOSPITAL_COMMUNITY): Payer: Self-pay

## 2024-06-18 MED ORDER — PANTOPRAZOLE SODIUM 20 MG PO TBEC
20.0000 mg | DELAYED_RELEASE_TABLET | Freq: Every day | ORAL | 0 refills | Status: DC
  Filled 2024-06-18: qty 180, 90d supply, fill #0

## 2024-07-09 ENCOUNTER — Other Ambulatory Visit (HOSPITAL_COMMUNITY): Payer: Self-pay

## 2024-07-09 ENCOUNTER — Encounter: Payer: Self-pay | Admitting: Podiatry

## 2024-07-09 ENCOUNTER — Ambulatory Visit: Admitting: Podiatry

## 2024-07-09 DIAGNOSIS — Q828 Other specified congenital malformations of skin: Secondary | ICD-10-CM

## 2024-07-09 DIAGNOSIS — L97521 Non-pressure chronic ulcer of other part of left foot limited to breakdown of skin: Secondary | ICD-10-CM

## 2024-07-09 MED ORDER — DOXYCYCLINE HYCLATE 100 MG PO TABS
100.0000 mg | ORAL_TABLET | Freq: Two times a day (BID) | ORAL | 0 refills | Status: AC
  Filled 2024-07-09: qty 14, 7d supply, fill #0

## 2024-07-09 NOTE — Progress Notes (Unsigned)
 Subjective: Chief Complaint  Patient presents with   Callouses    Rm12 Painful callous on right 2nd toe/ several weeks with no treatment.   68 year old female presents the office today with above concerns.    She said the calluses have gotten sick in the past and quite a bit discomfort.  She does not report any open lesions or any drainage.  The bruising she had previously has resolved.  No new concerns.   Objective: AAO x3, NAD DP/PT pulses palpable bilaterally, CRT less than 3 seconds; toes are warm and perfused. On the lateral aspect the foot on the fifth metatarsal base bilaterally there is hyperkeratotic tissue.  The left side is more diffuse on the right side is more focal.  There is no underlying ulceration drainage or any signs of infection.  No open lesions.  No drainage or pus. No pain with calf compression, swelling, warmth, erythema    Assessment: Hyperkeratotic lesions bilaterally  Plan: -All treatment options discussed with the patient including all alternatives, risks, complications.  -Sharply debrided the hyperkeratotic lesions with any complications or bleeding.  We discussed different moisturizers including urea cream to apply topically.  Continue supportive shoe gear to offload. -Patient encouraged to call the office with any questions, concerns, change in symptoms.   Donnice JONELLE Fees DPM

## 2024-07-20 ENCOUNTER — Other Ambulatory Visit (HOSPITAL_COMMUNITY): Payer: Self-pay

## 2024-07-20 DIAGNOSIS — H10412 Chronic giant papillary conjunctivitis, left eye: Secondary | ICD-10-CM | POA: Diagnosis not present

## 2024-07-20 MED ORDER — NEOMYCIN-POLYMYXIN-DEXAMETH 0.1 % OP SUSP
1.0000 [drp] | Freq: Four times a day (QID) | OPHTHALMIC | 0 refills | Status: AC
  Filled 2024-07-20: qty 5, 10d supply, fill #0

## 2024-07-21 ENCOUNTER — Other Ambulatory Visit (HOSPITAL_COMMUNITY): Payer: Self-pay

## 2024-07-21 MED ORDER — PREDNISONE 10 MG (21) PO TBPK
ORAL_TABLET | ORAL | 0 refills | Status: AC
  Filled 2024-07-21: qty 21, 6d supply, fill #0

## 2024-07-30 ENCOUNTER — Ambulatory Visit: Admitting: Podiatry

## 2024-08-01 ENCOUNTER — Other Ambulatory Visit (HOSPITAL_COMMUNITY): Payer: Self-pay

## 2024-08-01 MED ORDER — IBUPROFEN 800 MG PO TABS
800.0000 mg | ORAL_TABLET | Freq: Two times a day (BID) | ORAL | 0 refills | Status: AC | PRN
  Filled 2024-08-01: qty 60, 30d supply, fill #0

## 2024-08-14 ENCOUNTER — Telehealth: Payer: Self-pay | Admitting: Lab

## 2024-08-14 NOTE — Telephone Encounter (Signed)
 Patient calling stated that was seen and place on fungal treatment stopped treatment Dr. Gershon placed her on and is now healing well after a few treatments of the fungal medication prescribed by another provider was calling to cancel appointment for this coming Friday but wanted to give some incite on her status.

## 2024-08-15 ENCOUNTER — Other Ambulatory Visit: Payer: Self-pay

## 2024-08-15 ENCOUNTER — Other Ambulatory Visit (HOSPITAL_COMMUNITY): Payer: Self-pay

## 2024-08-15 MED ORDER — FLUCONAZOLE 200 MG PO TABS
ORAL_TABLET | ORAL | 0 refills | Status: AC
  Filled 2024-08-15: qty 4, 28d supply, fill #0

## 2024-08-15 MED ORDER — KETOCONAZOLE 2 % EX CREA
TOPICAL_CREAM | CUTANEOUS | 0 refills | Status: AC
  Filled 2024-08-15: qty 15, 7d supply, fill #0

## 2024-08-17 ENCOUNTER — Ambulatory Visit: Admitting: Podiatry

## 2024-08-28 ENCOUNTER — Ambulatory Visit: Admitting: Podiatry

## 2024-09-11 ENCOUNTER — Other Ambulatory Visit (HOSPITAL_COMMUNITY): Payer: Self-pay

## 2024-09-11 ENCOUNTER — Encounter (HOSPITAL_COMMUNITY): Payer: Self-pay

## 2024-09-11 ENCOUNTER — Other Ambulatory Visit: Payer: Self-pay

## 2024-09-11 MED ORDER — PANTOPRAZOLE SODIUM 20 MG PO TBEC
20.0000 mg | DELAYED_RELEASE_TABLET | Freq: Every day | ORAL | 0 refills | Status: AC
  Filled 2024-09-11 (×2): qty 180, 90d supply, fill #0

## 2024-09-13 ENCOUNTER — Other Ambulatory Visit (HOSPITAL_COMMUNITY): Payer: Self-pay

## 2024-09-17 ENCOUNTER — Other Ambulatory Visit (HOSPITAL_COMMUNITY): Payer: Self-pay

## 2025-01-15 ENCOUNTER — Ambulatory Visit: Admitting: Dermatology
